# Patient Record
Sex: Male | Born: 1970 | Race: Black or African American | Hispanic: No | Marital: Single | Smoking: Never smoker
Health system: Southern US, Community
[De-identification: ages and names within clinical notes are randomized; demographics above are authoritative.]

## PROBLEM LIST (undated history)

## (undated) DIAGNOSIS — F259 Schizoaffective disorder, unspecified: Secondary | ICD-10-CM

## (undated) DIAGNOSIS — F319 Bipolar disorder, unspecified: Secondary | ICD-10-CM

## (undated) DIAGNOSIS — N189 Chronic kidney disease, unspecified: Secondary | ICD-10-CM

## (undated) DIAGNOSIS — F419 Anxiety disorder, unspecified: Secondary | ICD-10-CM

## (undated) DIAGNOSIS — I10 Essential (primary) hypertension: Secondary | ICD-10-CM

## (undated) DIAGNOSIS — F32A Depression, unspecified: Secondary | ICD-10-CM

## (undated) DIAGNOSIS — E119 Type 2 diabetes mellitus without complications: Secondary | ICD-10-CM

## (undated) DIAGNOSIS — S069XAA Unspecified intracranial injury with loss of consciousness status unknown, initial encounter: Secondary | ICD-10-CM

## (undated) DIAGNOSIS — K219 Gastro-esophageal reflux disease without esophagitis: Secondary | ICD-10-CM

## (undated) DIAGNOSIS — S069X9A Unspecified intracranial injury with loss of consciousness of unspecified duration, initial encounter: Secondary | ICD-10-CM

## (undated) DIAGNOSIS — F329 Major depressive disorder, single episode, unspecified: Secondary | ICD-10-CM

## (undated) HISTORY — DX: Anxiety disorder, unspecified: F41.9

## (undated) HISTORY — DX: Gastro-esophageal reflux disease without esophagitis: K21.9

## (undated) HISTORY — DX: Depression, unspecified: F32.A

## (undated) HISTORY — DX: Major depressive disorder, single episode, unspecified: F32.9

---

## 2017-08-12 ENCOUNTER — Encounter: Payer: Self-pay | Admitting: Nurse Practitioner

## 2017-08-12 ENCOUNTER — Ambulatory Visit (INDEPENDENT_AMBULATORY_CARE_PROVIDER_SITE_OTHER): Payer: Medicare Other | Admitting: Nurse Practitioner

## 2017-08-12 VITALS — BP 104/65 | HR 73 | Temp 97.9°F | Ht 71.5 in | Wt 234.0 lb

## 2017-08-12 DIAGNOSIS — F258 Other schizoaffective disorders: Secondary | ICD-10-CM

## 2017-08-12 DIAGNOSIS — F259 Schizoaffective disorder, unspecified: Secondary | ICD-10-CM

## 2017-08-12 DIAGNOSIS — Z7689 Persons encountering health services in other specified circumstances: Secondary | ICD-10-CM

## 2017-08-12 DIAGNOSIS — K219 Gastro-esophageal reflux disease without esophagitis: Secondary | ICD-10-CM

## 2017-08-12 NOTE — Progress Notes (Signed)
Subjective:    Patient ID: Jonathan Cabrera, male    DOB: 06-Jun-1970, 47 y.o.   MRN: [id number]  Jonathan Cabrera is a 47 y.o. male presenting on 08/12/2017 for Establish Care   HPI Establish Care New Provider Pt last seen by PCP several month ago.  Obtain records.  Is moved into family care home with Jonathan Cabrera.  Was previously at [facility] in [place].  Originally from [facility]. Was in family care home x 4 years, but facility closed and went to stay with his mother and grandmother.  Was admitted from [facility].  Still has hallucinations occasionally, but has not had any physical outbursts.  GERD Pt has history of acid reflux and is taking lansoprazole 40 mg once daily.  Pt and caregiver notes symptoms are well controlled.  Patient denies globus sensation, choking, nighttime awakening with cough or heartburn.  Denies bleeding symptoms.  Denies abdominal pain, constipation, diarrhea. - Patient and caregiver acknowledge dietary indiscretions with lots of little Debbie fruit snacks and large sodas that are consumed within 1 to 2 days of purchase.  Schizoaffective disorder Patient is currently being managed at a psychiatrist in the [place] area.  This care relationship is continuing, but patient and caregiver prefer to transition care locally.  Per patient report, medication doses have not changed in the recent 2 to 3 months.  Past Medical History:  Diagnosis Date  . Anxiety   . Depression   . GERD (gastroesophageal reflux disease)    History reviewed. No pertinent surgical history. Social History   Socioeconomic History  . Marital status: Single    Spouse name: Not on file  . Number of children: Not on file  . Years of education: Not on file  . Highest education level: Not on file  Occupational History  . Not on file  Social Needs  . Financial resource strain: Not on file  . Food insecurity:    Worry: Not on file    Inability: Not on file  . Transportation needs:   Medical: Not on file    Non-medical: Not on file  Tobacco Use  . Smoking status: Never Smoker  . Smokeless tobacco: Never Used  Substance and Sexual Activity  . Alcohol use: Never    Frequency: Never  . Drug use: Never  . Sexual activity: Not on file  Lifestyle  . Physical activity:    Days per week: Not on file    Minutes per session: Not on file  . Stress: Not on file  Relationships  . Social connections:    Talks on phone: Not on file    Gets together: Not on file    Attends religious service: Not on file    Active member of club or organization: Not on file    Attends meetings of clubs or organizations: Not on file    Relationship status: Not on file  . Intimate partner violence:    Fear of current or ex partner: Not on file    Emotionally abused: Not on file    Physically abused: Not on file    Forced sexual activity: Not on file  Other Topics Concern  . Not on file  Social History Narrative  . Not on file   History reviewed. No pertinent family history. Current Outpatient Medications on File Prior to Visit  Medication Sig  . amLODipine (NORVASC) 10 MG tablet Take 10 mg by mouth daily.  . benztropine (COGENTIN) 1 MG tablet Take 1 mg by  mouth 2 (two) times daily.  . fluPHENAZine (PROLIXIN) 5 MG tablet Take 10 mg by mouth daily.  . fluPHENAZine (PROLIXIN) 5 MG tablet Take 13 mg by mouth at bedtime.  Marland Kitchen lithium carbonate (LITHOBID) 300 MG CR tablet Take 600 mg by mouth 2 (two) times daily.  . Melatonin 3 MG TABS Take 2 tablets by mouth at bedtime.   Marland Kitchen QUEtiapine (SEROQUEL) 100 MG tablet Take 100 mg by mouth 2 (two) times daily.  . QUEtiapine (SEROQUEL) 300 MG tablet Take 300 mg by mouth at bedtime.   No current facility-administered medications on file prior to visit.     Review of Systems  Constitutional: Negative.   HENT: Negative.   Eyes: Negative.   Respiratory: Negative.   Cardiovascular: Negative.   Gastrointestinal: Negative.   Endocrine: Negative.     Genitourinary: Negative.   Musculoskeletal: Negative.   Skin: Negative.   Allergic/Immunologic: Negative.   Neurological: Negative.   Hematological: Negative.   Psychiatric/Behavioral: Positive for hallucinations (Rare hallucination, but none in the recent past patient admits today.). Negative for behavioral problems, self-injury, sleep disturbance and suicidal ideas. The patient is not nervous/anxious.    Per HPI unless specifically indicated above     Objective:    BP 104/65 (BP Location: Left Arm, Patient Position: Sitting, Cuff Size: Large)   Pulse 73   Temp 97.9 F (36.6 C) (Oral)   Ht 5' 11.5" (1.816 m)   Wt 234 lb (106.1 kg)   BMI 32.18 kg/m   Wt Readings from Last 3 Encounters:  08/12/17 234 lb (106.1 kg)    Physical Exam  Constitutional: He is oriented to person, place, and time. He appears well-developed and well-nourished. No distress.  HENT:  Head: Normocephalic and atraumatic.  Cardiovascular: Normal rate, regular rhythm, S1 normal, S2 normal, normal heart sounds and intact distal pulses.  Pulmonary/Chest: Effort normal and breath sounds normal. No respiratory distress.  Abdominal: Soft. Bowel sounds are normal. He exhibits no distension. There is no hepatosplenomegaly. There is no tenderness. No hernia.  Neurological: He is alert and oriented to person, place, and time.  Skin: Skin is warm and dry. Capillary refill takes less than 2 seconds.  Psychiatric: He has a normal mood and affect. His behavior is normal. Thought content normal.  Vitals reviewed.  No results found for this or any previous visit.    Assessment & Plan:   Problem List Items Addressed This Visit      Digestive   Gastroesophageal reflux disease - Primary Currently well controlled on lansoprazole 40 mg once daily.  Plan: 1. Continue lansoprazole 40 mg once daily. Side effects discussed. Pt wants to continue med. 2. Avoid diet triggers. Reviewed need to seek care if globus sensation,  difficulty swallowing, s/sx of GI bleed. 3. Follow up as needed and in 3 months.      Other   Schizoaffective disorder, chronic condition (HCC) Stable without recent behavioral outbursts.  Only occasional hallucinations.  Patient currently managed with psychiatry in [place].  Ongoing relationship for medication prescription.  Plan to make referral to our but once Medicaid card has changed to [facility] and to me as PCP.    Other Visit Diagnoses    Encounter to establish care    Previous PCP was at [facility].  Records will be requested.  Past medical, family, and surgical history reviewed w/ pt and caregiver.    Very little family history is known.         Follow up  plan: Return in about 3 months (around 11/11/2017) for FASTING blood work and GERD, Hypertension.  Wilhelmina Mcardle, DNP, AGPCNP-BC Adult Gerontology Primary Care Nurse Practitioner [facility] 09/09/2017, 7:39 PM

## 2017-08-12 NOTE — Patient Instructions (Addendum)
Rishik Camera operator,   Thank you for coming in to clinic today.  1. Continue medications without changes.  2.  Cut back on snacks.  Only buy one package, buy diet sodas.  Choose healthier foods like fruits and vegetables.  3. Once you get your medicaid card changed,  Call for a referral to [facility] in [place].  You will be due for FASTING BLOOD WORK.  This means you should eat no food or drink after midnight.  Drink only water or coffee without cream/sugar on the morning of your lab visit. - Please go ahead and schedule a "Lab Only" visit in the morning at the clinic for lab draw at your next visit. - Your results will be available about 2-3 days after blood draw.  If you have set up a MyChart account, you can can log in to MyChart online to view your results and a brief explanation. Also, we can discuss your results together at your next office visit if you would like.   Please schedule a follow-up appointment with Wilhelmina Mcardle, AGNP. Return in about 3 months (around 11/11/2017) for FASTING blood work and GERD, Hypertension.  If you have any other questions or concerns, please feel free to call the clinic or send a message through MyChart. You may also schedule an earlier appointment if necessary.  You will receive a survey after today's visit either digitally by e-mail or paper by [employer]. Your experiences and feedback matter to [place].  Please respond so we know how we are doing as we provide care for you.   Wilhelmina Mcardle, DNP, AGNP-BC Adult Gerontology Nurse Practitioner [facility]

## 2017-09-03 ENCOUNTER — Other Ambulatory Visit: Payer: Self-pay | Admitting: Nurse Practitioner

## 2017-09-03 DIAGNOSIS — K219 Gastro-esophageal reflux disease without esophagitis: Secondary | ICD-10-CM

## 2017-09-05 ENCOUNTER — Other Ambulatory Visit: Payer: Self-pay | Admitting: Nurse Practitioner

## 2017-09-05 DIAGNOSIS — K219 Gastro-esophageal reflux disease without esophagitis: Secondary | ICD-10-CM

## 2017-09-05 MED ORDER — OMEPRAZOLE 40 MG PO CPDR: 40.0000 mg | DELAYED_RELEASE_CAPSULE | Freq: Every day | ORAL | 3 refills | Status: DC

## 2017-09-09 ENCOUNTER — Encounter: Payer: Self-pay | Admitting: Nurse Practitioner

## 2017-09-09 DIAGNOSIS — F258 Other schizoaffective disorders: Secondary | ICD-10-CM

## 2017-09-09 DIAGNOSIS — F259 Schizoaffective disorder, unspecified: Secondary | ICD-10-CM | POA: Insufficient documentation

## 2017-09-09 DIAGNOSIS — K219 Gastro-esophageal reflux disease without esophagitis: Secondary | ICD-10-CM | POA: Insufficient documentation

## 2017-11-12 ENCOUNTER — Ambulatory Visit: Payer: Medicaid Other | Admitting: Nurse Practitioner

## 2017-12-02 ENCOUNTER — Ambulatory Visit (INDEPENDENT_AMBULATORY_CARE_PROVIDER_SITE_OTHER): Payer: Medicare Other | Admitting: Nurse Practitioner

## 2017-12-02 ENCOUNTER — Encounter: Payer: Self-pay | Admitting: Nurse Practitioner

## 2017-12-02 ENCOUNTER — Other Ambulatory Visit: Payer: Self-pay

## 2017-12-02 VITALS — BP 122/73 | HR 84 | Temp 98.5°F | Ht 71.5 in | Wt 262.8 lb

## 2017-12-02 DIAGNOSIS — R635 Abnormal weight gain: Secondary | ICD-10-CM

## 2017-12-02 DIAGNOSIS — K5909 Other constipation: Secondary | ICD-10-CM

## 2017-12-02 DIAGNOSIS — I10 Essential (primary) hypertension: Secondary | ICD-10-CM

## 2017-12-02 DIAGNOSIS — R631 Polydipsia: Secondary | ICD-10-CM | POA: Diagnosis not present

## 2017-12-02 DIAGNOSIS — K219 Gastro-esophageal reflux disease without esophagitis: Secondary | ICD-10-CM | POA: Diagnosis not present

## 2017-12-02 DIAGNOSIS — Z79899 Other long term (current) drug therapy: Secondary | ICD-10-CM

## 2017-12-02 MED ORDER — POLYETHYLENE GLYCOL 3350 17 GM/SCOOP PO POWD: 17.0000 g | Freq: Every day | ORAL | 1 refills | Status: DC | PRN

## 2017-12-02 MED ORDER — OMEPRAZOLE 40 MG PO CPDR: 40.0000 mg | DELAYED_RELEASE_CAPSULE | Freq: Every day | ORAL | 3 refills | Status: DC

## 2017-12-02 MED ORDER — AMLODIPINE BESYLATE 10 MG PO TABS: 10.0000 mg | ORAL_TABLET | Freq: Every day | ORAL | 5 refills | Status: DC

## 2017-12-02 NOTE — Assessment & Plan Note (Signed)
Currently stable on omeprazole 40 mg once daily. Patient has no bleeding symptoms except lower GI bleeding more consistent with hemorrhoids.  Plan: 1. Continue omeprazole 40 mg once daily. 2. Encouraged diet modifications, weight loss. 3. Followup 3-6 months.

## 2017-12-02 NOTE — Patient Instructions (Addendum)
Jonathan Cabrera operatorpeaker,   Thank you for coming in to clinic today.  1. Limit yourself to one honey bun per week.    2. Limit yourself to one extra salty snack per week.  3. Continue your diet soda.  4. May start miralax 1 pack daily as needed to have a bowel movement at least every 2 days.  Please schedule a follow-up appointment with Wilhelmina McardleLauren Osric Klopf, AGNP. Return in about 3 months (around 03/04/2018) for hypertension, constipation.  If you have any other questions or concerns, please feel free to call the clinic or send a message through MyChart. You may also schedule an earlier appointment if necessary.  You will receive a survey after today's visit either digitally by e-mail or paper by [employer] mail. Your experiences and feedback matter to us.  Please respond so we know how we are doing as we provide care for you.   Wilhelmina McardleLauren Lilla Callejo, DNP, AGNP-BC Adult Gerontology Nurse Practitioner [facility]  Low Back Pain Exercises See other page with pictures of each exercise.  Start with 1 or 2 of these exercises that you are most comfortable with. Do not do any exercises that cause you significant worsening pain. Some of these may cause some "stretching soreness" but it should go away after you stop the exercise, and get better over time. Gradually increase up to 3-4 exercises as tolerated.  Standing hamstring stretch: Place the heel of your leg on a stool about 15 inches high. Keep your knee straight. Lean forward, bending at the hips until you feel a mild stretch in the back of your thigh. Make sure you do not roll your shoulders and bend at the waist when doing this or you will stretch your lower back instead. Hold the stretch for 15 to 30 seconds. Repeat 3 times. Repeat the same stretch on your other leg.  Cat and camel: Get down on your hands and knees. Let your stomach sag, allowing your back to curve downward. Hold this position for 5 seconds. Then arch your back and hold for 5  seconds. Do 3 sets of 10.  Quadriped Arm/Leg Raises: Get down on your hands and knees. Tighten your abdominal muscles to stiffen your spine. While keeping your abdominals tight, raise one arm and the opposite leg away from you. Hold this position for 5 seconds. Lower your arm and leg slowly and alternate sides. Do this 10 times on each side.  Pelvic tilt: Lie on your back with your knees bent and your feet flat on the floor. Tighten your abdominal muscles and push your lower back into the floor. Hold this position for 5 seconds, then relax. Do 3 sets of 10.  Partial curl: Lie on your back with your knees bent and your feet flat on the floor. Tighten your stomach muscles and flatten your back against the floor. Tuck your chin to your chest. With your hands stretched out in front of you, curl your upper body forward until your shoulders clear the floor. Hold this position for 3 seconds. Don't hold your breath. It helps to breathe out as you lift your shoulders up. Relax. Repeat 10 times. Build to 3 sets of 10. To challenge yourself, clasp your hands behind your head and keep your elbows out to the side.  Lower trunk rotation: Lie on your back with your knees bent and your feet flat on the floor. Tighten your abdominal muscles and push your lower back into the floor. Keeping your shoulders down flat,  gently rotate your legs to one side, then the other as far as you can. Repeat 10 to 20 times.  Single knee to chest stretch: Lie on your back with your legs straight out in front of you. Bring one knee up to your chest and grasp the back of your thigh. Pull your knee toward your chest, stretching your buttock muscle. Hold this position for 15 to 30 seconds and return to the starting position. Repeat 3 times on each side.  Double knee to chest: Lie on your back with your knees bent and your feet flat on the floor. Tighten your abdominal muscles and push your lower back into the floor. Pull both knees up to your  chest. Hold for 5 seconds and repeat 10 to 20 times.

## 2017-12-02 NOTE — Progress Notes (Signed)
Subjective:    Patient ID: Jonathan Cabrera, male    DOB: 07-26-1970, 47 y.o.   MRN: [id number]  Jonathan Cabrera is a 47 y.o. male presenting on 12/02/2017 for Constipation (no normal bowel movement x 1 mth)  Patient is accompanied today by his group home caregiver, Francena Hanly.  HPI Constipation Has had 3 days without bowel movement.    Prior was once daily.    Goes about 4 times in a week.  No blood on paper with wiping.  Has seen blood in stool 2 weeks ago.  Patient is very poor historian related to bowel movements.  - Drinks lots of water.   - Is not taking any medication to have bowel movements.  Patient does ask about suppositories as "family has used these in past."  Hypertension - He is not checking BP at home or outside of clinic.     - Current medications: amlodipine 10 mg once daily, tolerating well without side effects - He is not currently symptomatic. - Pt denies headache, lightheadedness, dizziness, changes in vision, chest tightness/pressure, palpitations, leg swelling, sudden loss of speech or loss of consciousness. - He  reports no regular exercise routine. - His diet is moderate in salt, high in fat, and high in carbohydrates.   Weight Gain Patient continues having significant dietary indiscretions.  At his group home, he is provided breakfast, lunch, dinner and 2 snacks daily.  He also purchases his own snacks at the store.  He regularly buys 2 bags pork rinds, 2 boxes honeybuns (12 total) and eats these within 4 days.  He is limited to this amount once per week as is Stella's expectation to limit patient's .  Social History   Tobacco Use  . Smoking status: Never Smoker  . Smokeless tobacco: Never Used  Substance Use Topics  . Alcohol use: Never    Frequency: Never  . Drug use: Never    Review of Systems Per HPI unless specifically indicated above     Objective:    BP 122/73 (BP Location: Left Arm, Patient Position: Sitting, Cuff Size: Large)   Pulse 84   Temp  98.5 F (36.9 C) (Oral)   Ht 5' 11.5" (1.816 m)   Wt 262 lb 12.8 oz (119.2 kg)   SpO2 99%   BMI 36.14 kg/m   Wt Readings from Last 3 Encounters:  12/02/17 262 lb 12.8 oz (119.2 kg)  08/12/17 234 lb (106.1 kg)    Physical Exam  Constitutional: He is oriented to person, place, and time. He appears well-developed and well-nourished. No distress.  HENT:  Head: Normocephalic and atraumatic.  Cardiovascular: Normal rate, regular rhythm, S1 normal, S2 normal, normal heart sounds and intact distal pulses.  Pulmonary/Chest: Effort normal and breath sounds normal. No respiratory distress.  Abdominal: Soft. Bowel sounds are normal. He exhibits no distension. There is no hepatosplenomegaly. There is tenderness (mild tenderness) in the right lower quadrant, epigastric area and left lower quadrant. There is no rigidity, no rebound, no guarding, no CVA tenderness, no tenderness at McBurney's point and negative Murphy's sign. No hernia.  Neurological: He is alert and oriented to person, place, and time.  Skin: Skin is warm and dry.  Psychiatric: He has a normal mood and affect. His behavior is normal.  Vitals reviewed.      Assessment & Plan:   Problem List Items Addressed This Visit      Cardiovascular and Mediastinum   Essential hypertension    Controlled hypertension.  BP goal <  130/80.  Pt is not working on lifestyle modifications.  Taking medications tolerating well without side effects. No known complications, needs labs.  Plan: 1. Continue taking amlodipine 10 mg once daily 2. Obtain labs today.  3. Encouraged heart healthy diet and increasing exercise to 30 minutes most days of the week. 4. Check BP 1-2 x per week at home, keep log, and bring to clinic at next appointment. 5. Follow up 3 months.        Relevant Medications   amLODipine (NORVASC) 10 MG tablet   Other Relevant Orders   COMPLETE METABOLIC PANEL WITH GFR   Comprehensive metabolic panel     Digestive    Gastroesophageal reflux disease    Currently stable on omeprazole 40 mg once daily. Patient has no bleeding symptoms except lower GI bleeding more consistent with hemorrhoids.  Plan: 1. Continue omeprazole 40 mg once daily. 2. Encouraged diet modifications, weight loss. 3. Followup 3-6 months.      Relevant Medications   omeprazole (PRILOSEC) 40 MG capsule   polyethylene glycol powder (GLYCOLAX/MIRALAX) powder   Other Relevant Orders   COMPLETE METABOLIC PANEL WITH GFR   CBC with Differential/Platelet   Comprehensive metabolic panel     Other   Weight gain finding    Other Visit Diagnoses    Other constipation    -  Primary   Relevant Medications   polyethylene glycol powder (GLYCOLAX/MIRALAX) powder   Polydipsia       Relevant Orders   Hemoglobin A1c   High risk medication use       Relevant Orders   Hemoglobin A1c   Comprehensive metabolic panel      # Weight Gain: Patient with approx 30 lb weight gain over last 4 months.  Patient with significant dietary indiscretions.   Plan: - Reduce honey buns to 1 per week.  - Reduce salty snack to 1 per week.  - Continue eating regular meals as provided at group home. - Increase physical activity.  # Polydipsia Patient is on high risk psychiatric medications for potential to develop DM.  With weight gain and polydipsia, need to screen for development of DM.  # Constipation: Patient with intermittent constipation.  Possible development of hemorrhoids.  No current rectal/perianal pain.  Patient with RLQ and LLQ pain which is consistent with constipation. - START miralax 1 packet daily prn if no BM in 2 days. - Encourage continued water intake. - Followup prn and in 3 months.  Meds ordered this encounter  Medications  . omeprazole (PRILOSEC) 40 MG capsule    Sig: Take 1 capsule (40 mg total) by mouth daily.    Dispense:  30 capsule    Refill:  3    Order Specific Question:   Supervising Provider    Answer:   Smitty CordsKARAMALEGOS,  ALEXANDER J [[id number]]  . amLODipine (NORVASC) 10 MG tablet    Sig: Take 1 tablet (10 mg total) by mouth daily.    Dispense:  30 tablet    Refill:  5    Order Specific Question:   Supervising Provider    Answer:   Smitty CordsKARAMALEGOS, ALEXANDER J [[id number]]  . polyethylene glycol powder (GLYCOLAX/MIRALAX) powder    Sig: Take 17 g by mouth daily as needed for mild constipation (Take if no BM in 2 days).    Dispense:  3350 g    Refill:  1    Order Specific Question:   Supervising Provider    Answer:   Smitty CordsKARAMALEGOS, ALEXANDER J [[id number]]  Follow up plan: Return in about 3 months (around 03/04/2018) for hypertension, constipation.  Wilhelmina McardleLauren Janani Chamber, DNP, AGPCNP-BC Adult Gerontology Primary Care Nurse Practitioner [facility] 12/02/2017, 10:53 PM

## 2017-12-02 NOTE — Assessment & Plan Note (Signed)
Controlled hypertension.  BP goal < 130/80.  Pt is not working on lifestyle modifications.  Taking medications tolerating well without side effects. No known complications, needs labs.  Plan: 1. Continue taking amlodipine 10 mg once daily 2. Obtain labs today.  3. Encouraged heart healthy diet and increasing exercise to 30 minutes most days of the week. 4. Check BP 1-2 x per week at home, keep log, and bring to clinic at next appointment. 5. Follow up 3 months.   

## 2017-12-04 LAB — COMPLETE METABOLIC PANEL WITH GFR
AG Ratio: 1.9 (calc) (ref 1.0–2.5)
ALT: 13 U/L (ref 9–46)
AST: 14 U/L (ref 10–40)
Albumin: 4.3 g/dL (ref 3.6–5.1)
Alkaline phosphatase (APISO): 86 U/L (ref 40–115)
BUN: 10 mg/dL (ref 7–25)
CO2: 25 mmol/L (ref 20–32)
Calcium: 9.5 mg/dL (ref 8.6–10.3)
Chloride: 106 mmol/L (ref 98–110)
Creat: 1.3 mg/dL (ref 0.60–1.35)
GFR, Est African American: 77 mL/min/{1.73_m2} (ref >=60)
GFR, Est Non African American: 66 mL/min/{1.73_m2} (ref >=60)
Globulin: 2.3 g/dL (calc) (ref 1.9–3.7)
Glucose, Bld: 69 mg/dL (ref 65–99)
Potassium: 4.2 mmol/L (ref 3.5–5.3)
Sodium: 139 mmol/L (ref 135–146)
Total Bilirubin: 0.7 mg/dL (ref 0.2–1.2)
Total Protein: 6.6 g/dL (ref 6.1–8.1)

## 2017-12-04 LAB — CBC WITH DIFFERENTIAL/PLATELET
Basophils Absolute: 41 cells/uL (ref 0–200)
Basophils Relative: 1 %
Eosinophils Absolute: 250 cells/uL (ref 15–500)
Eosinophils Relative: 6.1 %
HCT: 42.5 % (ref 38.5–50.0)
Hemoglobin: 13.9 g/dL (ref 13.2–17.1)
Lymphs Abs: 1251 cells/uL (ref 850–3900)
MCH: 26.8 pg — ABNORMAL LOW (ref 27.0–33.0)
MCHC: 32.7 g/dL (ref 32.0–36.0)
MCV: 81.9 fL (ref 80.0–100.0)
MPV: 9.2 fL (ref 7.5–12.5)
Monocytes Relative: 10.2 %
Neutro Abs: 2140 cells/uL (ref 1500–7800)
Neutrophils Relative %: 52.2 %
Platelets: 236 10*3/uL (ref 140–400)
RBC: 5.19 10*6/uL (ref 4.20–5.80)
RDW: 13.3 % (ref 11.0–15.0)
Total Lymphocyte: 30.5 %
WBC mixed population: 418 cells/uL (ref 200–950)
WBC: 4.1 10*3/uL (ref 3.8–10.8)

## 2017-12-04 LAB — HEMOGLOBIN A1C W/OUT EAG: Hgb A1c MFr Bld: 5.2 % of total Hgb (ref <5.7)

## 2018-03-05 ENCOUNTER — Ambulatory Visit (INDEPENDENT_AMBULATORY_CARE_PROVIDER_SITE_OTHER): Payer: Medicare Other | Admitting: Nurse Practitioner

## 2018-03-05 ENCOUNTER — Encounter: Payer: Self-pay | Admitting: Nurse Practitioner

## 2018-03-05 ENCOUNTER — Other Ambulatory Visit: Payer: Self-pay

## 2018-03-05 VITALS — BP 134/77 | HR 83 | Temp 98.7°F | Ht 71.5 in | Wt 271.0 lb

## 2018-03-05 DIAGNOSIS — I10 Essential (primary) hypertension: Secondary | ICD-10-CM

## 2018-03-05 DIAGNOSIS — K5900 Constipation, unspecified: Secondary | ICD-10-CM

## 2018-03-05 DIAGNOSIS — Z23 Encounter for immunization: Secondary | ICD-10-CM | POA: Diagnosis not present

## 2018-03-05 DIAGNOSIS — R635 Abnormal weight gain: Secondary | ICD-10-CM | POA: Diagnosis not present

## 2018-03-05 NOTE — Patient Instructions (Signed)

## 2018-03-06 ENCOUNTER — Encounter: Payer: Self-pay | Admitting: Nurse Practitioner

## 2018-03-06 ENCOUNTER — Ambulatory Visit: Payer: Medicare Other | Admitting: Nurse Practitioner

## 2018-03-06 NOTE — Progress Notes (Signed)
Subjective:    Patient ID: Jonathan Cabrera, male    DOB: 09-Apr-1971, 47 y.o.   MRN: [id number]  Jonathan Cabrera is a 47 y.o. male presenting on 03/05/2018 for Hypertension and Constipation (pt reports not a normal bm x 3days, he haven't started the miralox )   HPI Hypertension - He is not checking BP at home or outside of clinic.    - Current medications: amlodipine 10 mg once daily, tolerating well without side effects - He is not currently symptomatic. - Pt denies headache, lightheadedness, dizziness, changes in vision, chest tightness/pressure, palpitations, leg swelling, sudden loss of speech or loss of consciousness. - He  reports no regular exercise routine, but does take short walks regularly. - His diet is high in salt, high in fat, and high in carbohydrates.   Constipation Has not started Miralax as ordered at last visit. Has not had BM in 3 days.  Usually has BM every other day.   - Has had small BM,  - Jonathan HanlyStella is controlling access to snacks with honey buns /chips/pork rinds/cream pie/raisin cakes/ sodas.  Snacks around 1pm. Breakfast 8:30 , lunch 12, dinner 3:30-4.  Then he wants to walk to restaurant for snack after 6-8 pm. (Chinese - egg roll, tea OR [employer] hot dogs  - onion rings, hot dog)  Weight Gain Patient continues to have weight gain.  He admits he takes a walk and tries to get a snack when he feels agitated, overworked sometimes.   Social History   Tobacco Use  . Smoking status: Never Smoker  . Smokeless tobacco: Never Used  Substance Use Topics  . Alcohol use: Never    Frequency: Never  . Drug use: Never    Review of Systems Per HPI unless specifically indicated above     Objective:    BP 134/77 (BP Location: Right Arm, Patient Position: Sitting, Cuff Size: Large)   Pulse 83   Temp 98.7 F (37.1 C) (Oral)   Ht 5' 11.5" (1.816 m)   Wt 271 lb (122.9 kg)   BMI 37.27 kg/m   Wt Readings from Last 3 Encounters:  03/05/18 271 lb (122.9 kg)  12/02/17  262 lb 12.8 oz (119.2 kg)  08/12/17 234 lb (106.1 kg)    Physical Exam  Constitutional: He is oriented to person, place, and time. He appears well-developed and well-nourished. No distress.  HENT:  Head: Normocephalic and atraumatic.  Cardiovascular: Normal rate, regular rhythm, S1 normal, S2 normal, normal heart sounds and intact distal pulses.  Pulmonary/Chest: Effort normal and breath sounds normal. No respiratory distress.  Neurological: He is alert and oriented to person, place, and time.  Skin: Skin is warm and dry.  Psychiatric: He has a normal mood and affect. His behavior is normal.  Vitals reviewed.  Results for orders placed or performed in visit on 12/02/17  COMPLETE METABOLIC PANEL WITH GFR  Result Value Ref Range   Glucose, Bld 69 65 - 99 mg/dL   BUN 10 7 - 25 mg/dL   Creat 1.301.30 8.650.60 - 7.841.35 mg/dL   GFR, Est Non African American 66 > OR = 60 mL/min/1.4973m2   GFR, Est African American 77 > OR = 60 mL/min/1.7073m2   BUN/Creatinine Ratio NOT APPLICABLE 6 - 22 (calc)   Sodium 139 135 - 146 mmol/L   Potassium 4.2 3.5 - 5.3 mmol/L   Chloride 106 98 - 110 mmol/L   CO2 25 20 - 32 mmol/L   Calcium 9.5 8.6 - 10.3 mg/dL  Total Protein 6.6 6.1 - 8.1 g/dL   Albumin 4.3 3.6 - 5.1 g/dL   Globulin 2.3 1.9 - 3.7 g/dL (calc)   AG Ratio 1.9 1.0 - 2.5 (calc)   Total Bilirubin 0.7 0.2 - 1.2 mg/dL   Alkaline phosphatase (APISO) 86 40 - 115 U/L   AST 14 10 - 40 U/L   ALT 13 9 - 46 U/L  Hemoglobin A1C w/out eAG  Result Value Ref Range   Hgb A1c MFr Bld 5.2 <5.7 % of total Hgb  CBC with Differential/Platelet  Result Value Ref Range   WBC 4.1 3.8 - 10.8 Thousand/uL   RBC 5.19 4.20 - 5.80 Million/uL   Hemoglobin 13.9 13.2 - 17.1 g/dL   HCT 11.9 14.7 - 82.9 %   MCV 81.9 80.0 - 100.0 fL   MCH 26.8 (L) 27.0 - 33.0 pg   MCHC 32.7 32.0 - 36.0 g/dL   RDW 56.2 13.0 - 86.5 %   Platelets 236 140 - 400 Thousand/uL   MPV 9.2 7.5 - 12.5 fL   Neutro Abs 2,140 1,500 - 7,800 cells/uL   Lymphs  Abs 1,251 850 - 3,900 cells/uL   WBC mixed population 418 200 - 950 cells/uL   Eosinophils Absolute 250 15 - 500 cells/uL   Basophils Absolute 41 0 - 200 cells/uL   Neutrophils Relative % 52.2 %   Total Lymphocyte 30.5 %   Monocytes Relative 10.2 %   Eosinophils Relative 6.1 %   Basophils Relative 1.0 %      Assessment & Plan:   Problem List Items Addressed This Visit      Cardiovascular and Mediastinum   Essential hypertension - Primary    Controlled hypertension.  BP goal < 130/80.  Pt is not working on lifestyle modifications.  Taking medications tolerating well without side effects. No known complications, needs labs.  Plan: 1. Continue taking amlodipine 10 mg once daily 2. Obtain labs today.  3. Encouraged heart healthy diet and increasing exercise to 30 minutes most days of the week. 4. Check BP 1-2 x per week at home, keep log, and bring to clinic at next appointment. 5. Follow up 3 months.          Other   Weight gain finding    Progressively worsening, but at slower interval than prior 3 months follow-up.  Patient is now having snacks controlled mostly by Jonathan Cabrera the home supervisor.  He still gets snacks during the week independently, however.  Sometimes, increases with need to have break/distraction from stressor.  Plan: 1. Discussed taking a walk when agitated.  Reduce extra snacks to once weekly. 2. Continue reducing some psychotropic meds as able with psychiatry.   3. Follow-up 3 mos       Other Visit Diagnoses    Constipation, unspecified constipation type     Mild constipation with bloating.  No use of miralax as ordered at last visit. Likely has low fiber diet. - Encouraged high fiber diet, adequate water intake. - Start miralax prn. - Follow-up prn.    Needs flu shot     Pt < age 31.  Needs annual influenza vaccine.  Plan: 1. Administer Quad flu vaccine.    Relevant Orders   Flu Vaccine QUAD 6+ mos PF IM (Fluarix Quad PF) (Completed)      Follow  up plan: Return in about 3 months (around 06/05/2018).  Wilhelmina Mcardle, DNP, AGPCNP-BC Adult Gerontology Primary Care Nurse Practitioner [facility]  03/06/2018, 4:39 PM

## 2018-03-06 NOTE — Assessment & Plan Note (Signed)
Controlled hypertension.  BP goal < 130/80.  Pt is not working on lifestyle modifications.  Taking medications tolerating well without side effects. No known complications, needs labs.  Plan: 1. Continue taking amlodipine 10 mg once daily 2. Obtain labs today.  3. Encouraged heart healthy diet and increasing exercise to 30 minutes most days of the week. 4. Check BP 1-2 x per week at home, keep log, and bring to clinic at next appointment. 5. Follow up 3 months.

## 2018-03-06 NOTE — Assessment & Plan Note (Signed)
Progressively worsening, but at slower interval than prior 3 months follow-up.  Patient is now having snacks controlled mostly by Francena HanlyStella the home supervisor.  He still gets snacks during the week independently, however.  Sometimes, increases with need to have break/distraction from stressor.  Plan: 1. Discussed taking a walk when agitated.  Reduce extra snacks to once weekly. 2. Continue reducing some psychotropic meds as able with psychiatry.   3. Follow-up 3 mos

## 2018-04-03 ENCOUNTER — Other Ambulatory Visit: Payer: Self-pay | Admitting: Nurse Practitioner

## 2018-04-03 DIAGNOSIS — K219 Gastro-esophageal reflux disease without esophagitis: Secondary | ICD-10-CM

## 2018-04-28 ENCOUNTER — Other Ambulatory Visit: Payer: Self-pay

## 2018-04-28 ENCOUNTER — Encounter: Payer: Self-pay | Admitting: Emergency Medicine

## 2018-04-28 ENCOUNTER — Emergency Department
Admission: EM | Admit: 2018-04-28 | Discharge: 2018-04-28 | Disposition: A | Discharge status: Home / Self Care | Payer: Self-pay | Attending: Emergency Medicine | Admitting: Emergency Medicine

## 2018-04-28 DIAGNOSIS — I10 Essential (primary) hypertension: Secondary | ICD-10-CM | POA: Insufficient documentation

## 2018-04-28 DIAGNOSIS — R55 Syncope and collapse: Secondary | ICD-10-CM | POA: Insufficient documentation

## 2018-04-28 DIAGNOSIS — Z79899 Other long term (current) drug therapy: Secondary | ICD-10-CM | POA: Insufficient documentation

## 2018-04-28 HISTORY — DX: Unspecified intracranial injury with loss of consciousness status unknown, initial encounter: S06.9XAA

## 2018-04-28 HISTORY — DX: Unspecified intracranial injury with loss of consciousness of unspecified duration, initial encounter: S06.9X9A

## 2018-04-28 HISTORY — DX: Schizoaffective disorder, unspecified: F25.9

## 2018-04-28 HISTORY — DX: Bipolar disorder, unspecified: F31.9

## 2018-04-28 LAB — BASIC METABOLIC PANEL
ANION GAP: 6 (ref 5–15)
BUN: 11 mg/dL (ref 6–20)
CO2: 22 mmol/L (ref 22–32)
Calcium: 9.8 mg/dL (ref 8.9–10.3)
Chloride: 108 mmol/L (ref 98–111)
Creatinine, Ser: 0.97 mg/dL (ref 0.61–1.24)
GFR calc Af Amer: >60 mL/min (ref >60)
GFR calc non Af Amer: >60 mL/min (ref >60)
Glucose, Bld: 108 mg/dL — ABNORMAL HIGH (ref 70–99)
Potassium: 3.9 mmol/L (ref 3.5–5.1)
Sodium: 136 mmol/L (ref 135–145)

## 2018-04-28 LAB — URINALYSIS, COMPLETE (UACMP) WITH MICROSCOPIC
Bacteria, UA: NONE SEEN
Bilirubin Urine: NEGATIVE
GLUCOSE, UA: NEGATIVE mg/dL
Hgb urine dipstick: NEGATIVE
Ketones, ur: NEGATIVE mg/dL
Leukocytes, UA: NEGATIVE
Nitrite: NEGATIVE
Protein, ur: NEGATIVE mg/dL
Specific Gravity, Urine: 1.005 (ref 1.005–1.030)
pH: 7 (ref 5.0–8.0)

## 2018-04-28 LAB — CBC
HCT: 44.9 % (ref 39.0–52.0)
Hemoglobin: 14.4 g/dL (ref 13.0–17.0)
MCH: 26.1 pg (ref 26.0–34.0)
MCHC: 32.1 g/dL (ref 30.0–36.0)
MCV: 81.5 fL (ref 80.0–100.0)
Platelets: 337 10*3/uL (ref 150–400)
RBC: 5.51 MIL/uL (ref 4.22–5.81)
RDW: 14 % (ref 11.5–15.5)
WBC: 7.5 10*3/uL (ref 4.0–10.5)
nRBC: 0 % (ref 0.0–0.2)

## 2018-04-28 NOTE — ED Notes (Signed)
Pt arrived via ems from [facility] with concerns over fall. Pt arrives a & o x 4. EMS HR 80, BP 132/84, O2 99%. Pt unsure of events leading up to fall. EMS reports upon their arrival pt was on the ground unresponsive. No medications administered. Pt unresponsive for an estimated 5-6 minutes

## 2018-04-28 NOTE — ED Triage Notes (Signed)
States he lives in a family care home. States he came by EMS due to syncopal episode at care home. Denies any discomfort now. Conversation cleatr and coherent.

## 2018-04-28 NOTE — ED Notes (Signed)
Pt given graham crackers with peanut butter and a cola per EDP.

## 2018-04-28 NOTE — ED Provider Notes (Signed)
[facility] Emergency Department Provider Note   ____________________________________________    I have reviewed the triage vital signs and the nursing notes.   HISTORY  Chief Complaint Loss of Consciousness     HPI Jonathan Cabrera is a 48 y.o. male with a history of schizoaffective disorder, GERD, anxiety, TBI who presents today after reported syncopal episode.  Patient lives in a group home and apparently had a syncopal episode today.  He does not remember what happened but does remember that he felt lightheaded prior to the event.  Denies chest pain shortness of breath.  Currently feels well and has no complaints.  No palpitations.  No nausea or vomiting or abdominal pain   Past Medical History:  Diagnosis Date  . Anxiety   . Bipolar 1 disorder (HCC)   . Depression   . GERD (gastroesophageal reflux disease)   . Schizoaffective disorder (HCC)   . TBI (traumatic brain injury) [facility](HCC)     Patient Active Problem List   Diagnosis Date Noted  . Essential hypertension 12/02/2017  . Weight gain finding 12/02/2017  . Schizoaffective disorder, chronic condition (HCC) 09/09/2017  . Gastroesophageal reflux disease 09/09/2017    History reviewed. No pertinent surgical history.  Prior to Admission medications   Medication Sig Start Date End Date Taking? Authorizing Provider  amLODipine (NORVASC) 10 MG tablet Take 1 tablet (10 mg total) by mouth daily. 12/02/17   Galen ManilaKennedy, Lauren Renee, NP  benztropine (COGENTIN) 1 MG tablet Take 1 mg by mouth 2 (two) times daily.    [provider]  fluPHENAZine (PROLIXIN) 5 MG tablet Take 10 mg by mouth daily.    [provider]  fluPHENAZine (PROLIXIN) 5 MG tablet Take 13 mg by mouth at bedtime.    [provider]  lithium carbonate (LITHOBID) 300 MG CR tablet Take 600 mg by mouth 2 (two) times daily.    [provider]  Melatonin 3 MG TABS Take 2 tablets by mouth at bedtime.      [provider]  omeprazole (PRILOSEC) 40 MG capsule TAKE ONE CAPSULE BY MOUTH EVERY DAY *DO NOT CRUSH* 04/04/18   Galen ManilaKennedy, Lauren Renee, NP  polyethylene glycol powder (GLYCOLAX/MIRALAX) powder Take 17 g by mouth daily as needed for mild constipation (Take if no BM in 2 days). 12/02/17   Galen ManilaKennedy, Lauren Renee, NP  QUEtiapine (SEROQUEL) 100 MG tablet Take 100 mg by mouth 2 (two) times daily.    [provider]  QUEtiapine (SEROQUEL) 300 MG tablet Take 300 mg by mouth at bedtime.    [provider]     Allergies Erythromycin and Penicillins  No family history on file.  Social History Social History   Tobacco Use  . Smoking status: Never Smoker  . Smokeless tobacco: Never Used  Substance Use Topics  . Alcohol use: Never    Frequency: Never  . Drug use: Never    Review of Systems  Constitutional: No dizziness Eyes: No visual changes.  ENT: No neck pain Cardiovascular: Denies chest pain. Respiratory: Denies shortness of breath. Gastrointestinal: No abdominal pain.    Genitourinary: Negative for dysuria. Musculoskeletal: Negative for back pain. Skin: Negative for rash. Neurological: Negative for headaches    ____________________________________________   PHYSICAL EXAM:  VITAL SIGNS: ED Triage Vitals [04/28/18 1249]  Enc Vitals Group     BP (!) 150/102     Pulse Rate 77     Resp 18     Temp 98.3 F (36.8 C)  Temp Source Oral     SpO2 96 %     Weight 121.6 kg (268 lb)     Height 1.829 m (6')     Head Circumference      Peak Flow      Pain Score 0     Pain Loc      Pain Edu?      Excl. in GC?     Constitutional: Alert and oriented. No acute distress.  Eyes: Conjunctivae are normal.   Nose: No congestion/rhinnorhea. Mouth/Throat: Mucous membranes are moist.   Neck:  Painless ROM Cardiovascular: Normal rate, regular rhythm. Grossly normal heart sounds.  Good peripheral circulation. Respiratory: Normal respiratory effort.  No  retractions. Lungs CTAB. Gastrointestinal: Soft and nontender. No distention.  No CVA tenderness.  Musculoskeletal: a.  Warm and well perfused Neurologic:  Normal speech and language. No gross focal neurologic deficits are appreciated.  Skin:  Skin is warm, dry and intact. No rash noted. Psychiatric: Mood and affect are normal. Speech and behavior are normal.  ____________________________________________   LABS (all labs ordered are listed, but only abnormal results are displayed)  Labs Reviewed  BASIC METABOLIC PANEL - Abnormal; Notable for the following components:      Result Value   Glucose, Bld 108 (*)    All other components within normal limits  URINALYSIS, COMPLETE (UACMP) WITH MICROSCOPIC - Abnormal; Notable for the following components:   Color, Urine STRAW (*)    APPearance CLEAR (*)    All other components within normal limits  CBC   ____________________________________________  EKG  ED ECG REPORT I, Jene Every, the attending physician, personally viewed and interpreted this ECG.  Date: 04/28/2018  Rhythm: normal sinus rhythm QRS Axis: normal Intervals: normal ST/T Wave abnormalities: normal Narrative Interpretation: no evidence of acute ischemia  ____________________________________________  RADIOLOGY  None ____________________________________________   PROCEDURES  Procedure(s) performed: No  Procedures   Critical Care performed: No ____________________________________________   INITIAL IMPRESSION / ASSESSMENT AND PLAN / ED COURSE  Pertinent labs & imaging results that were available during my care of the patient were reviewed by me and considered in my medical decision making (see chart for details).  Patient well-appearing and in no acute distress.  Exam is reassuring, work-up is unremarkable.  EKG normal.  He feels quite well, no dizziness with standing.  Appropriate for discharge at this point with outpatient follow-up      ____________________________________________   FINAL CLINICAL IMPRESSION(S) / ED DIAGNOSES  Final diagnoses:  Syncope and collapse        Note:  This document was prepared using Dragon voice recognition software and may include unintentional dictation errors.   Jene Every, MD 04/28/18 [phone]

## 2018-04-28 NOTE — Discharge Instructions (Addendum)
Jonathan Cabrera tests were reassuring in the ED however he will require follow up with his PCP

## 2018-04-28 NOTE — ED Notes (Signed)
[facility] called, Stela spoken to who states she will be providing transportation for pt.

## 2018-06-03 ENCOUNTER — Other Ambulatory Visit: Payer: Self-pay

## 2018-06-03 ENCOUNTER — Encounter: Payer: Self-pay | Admitting: Nurse Practitioner

## 2018-06-03 ENCOUNTER — Ambulatory Visit (INDEPENDENT_AMBULATORY_CARE_PROVIDER_SITE_OTHER): Payer: Medicare Other | Admitting: Nurse Practitioner

## 2018-06-03 ENCOUNTER — Telehealth: Payer: Self-pay | Admitting: Nurse Practitioner

## 2018-06-03 VITALS — BP 132/89 | HR 87 | Temp 98.6°F | Resp 20 | Ht 72.0 in | Wt 281.4 lb

## 2018-06-03 DIAGNOSIS — B370 Candidal stomatitis: Secondary | ICD-10-CM | POA: Diagnosis not present

## 2018-06-03 DIAGNOSIS — R635 Abnormal weight gain: Secondary | ICD-10-CM | POA: Diagnosis not present

## 2018-06-03 DIAGNOSIS — I10 Essential (primary) hypertension: Secondary | ICD-10-CM

## 2018-06-03 DIAGNOSIS — F258 Other schizoaffective disorders: Secondary | ICD-10-CM

## 2018-06-03 DIAGNOSIS — F259 Schizoaffective disorder, unspecified: Secondary | ICD-10-CM

## 2018-06-03 DIAGNOSIS — K5909 Other constipation: Secondary | ICD-10-CM | POA: Diagnosis not present

## 2018-06-03 MED ORDER — POLYETHYLENE GLYCOL 3350 17 GM/SCOOP PO POWD: 8.5000 g | Freq: Every day | ORAL | 1 refills | Status: DC

## 2018-06-03 MED ORDER — NYSTATIN 100000 UNIT/ML MT SUSP: 5.0000 mL | Freq: Four times a day (QID) | OROMUCOSAL | 0 refills | Status: AC

## 2018-06-03 NOTE — Progress Notes (Signed)
Subjective:    Patient ID: Jonathan Cabrera, male    DOB: 01/11/1971, 48 y.o.   MRN: [id number]  Jonathan BailiffMorris Mages is a 48 y.o. male presenting on 06/03/2018 for Hypertension; Ear Pain (bilateral ear discomfort w/ difficulty hearing, ear irritation); and Constipation   HPI  Constipation Patient has not been giving Miralax because patient is not requesting doses.  When taking, didn't work but caused bloating.  Patient is going to have to administer suppository himself.   - Patient has about 3 days between bowel movements.  Patient has small BM and doesn't feel completely empty. Patient took only 1-2 doses  Ear symptoms - Hearing difficulty, ears feeling clogged.  Patient in past has had fluid in ear and has had abx with improvement in past.  Is now also sore.  Patient has had no other cough/congestion.    Weight Patient has increased additional 10 lbs.   - [employer] visits continue about 3-4 days per week.  Housemates have stopped helping him get food. - [place] [facility] - monitoring weight medications  - some blood work in past.  Patient is working to reduce Seroquel with psychiatry currently to use a different medication.  Hypertension - He is not checking BP at home or outside of clinic.    - Current medications: tolerating well without side effects - He is not currently symptomatic. - Pt denies headache, lightheadedness, dizziness, changes in vision, chest tightness/pressure, palpitations, leg swelling, sudden loss of speech or loss of consciousness. - He  reports an exercise routine that includes walking, 20 minutes, 4 days per week. - His diet is moderate in salt, high in fat, and high in carbohydrates.   Social History   Tobacco Use  . Smoking status: Never Smoker  . Smokeless tobacco: Never Used  Substance Use Topics  . Alcohol use: Never    Frequency: Never  . Drug use: Never    Review of Systems Per HPI unless specifically indicated above     Objective:      BP 132/89 (BP Location: Left Arm, Patient Position: Sitting, Cuff Size: Large)   Pulse 87   Temp 98.6 F (37 C) (Oral)   Resp 20   Ht 6' (1.829 m)   Wt 281 lb 6.4 oz (127.6 kg)   SpO2 100%   BMI 38.16 kg/m   Wt Readings from Last 3 Encounters:  06/03/18 281 lb 6.4 oz (127.6 kg)  04/28/18 268 lb (121.6 kg)  03/05/18 271 lb (122.9 kg)    Physical Exam Vitals signs reviewed.  Constitutional:      General: He is not in acute distress.    Appearance: He is well-developed. He is morbidly obese.  HENT:     Head: Normocephalic and atraumatic.  Cardiovascular:     Rate and Rhythm: Normal rate and regular rhythm.     Heart sounds: Normal heart sounds, S1 normal and S2 normal.  Pulmonary:     Effort: Pulmonary effort is normal. No respiratory distress.     Breath sounds: Normal breath sounds.  Skin:    General: Skin is warm and dry.     Capillary Refill: Capillary refill takes less than 2 seconds.  Neurological:     General: No focal deficit present.     Mental Status: He is alert and oriented to person, place, and time. Mental status is at baseline.  Psychiatric:        Attention and Perception: Attention normal.  Mood and Affect: Mood normal.        Speech: Speech is rapid and pressured.        Behavior: Behavior normal.        Thought Content: Thought content normal.        Judgment: Judgment normal.     Results for orders placed or performed during the hospital encounter of 04/28/18  Basic metabolic panel  Result Value Ref Range   Sodium 136 135 - 145 mmol/L   Potassium 3.9 3.5 - 5.1 mmol/L   Chloride 108 98 - 111 mmol/L   CO2 22 22 - 32 mmol/L   Glucose, Bld 108 (H) 70 - 99 mg/dL   BUN 11 6 - 20 mg/dL   Creatinine, Ser 8.81 0.61 - 1.24 mg/dL   Calcium 9.8 8.9 - 10.3 mg/dL   GFR calc non Af Amer >60 >60 mL/min   GFR calc Af Amer >60 >60 mL/min   Anion gap 6 5 - 15  CBC  Result Value Ref Range   WBC 7.5 4.0 - 10.5 K/uL   RBC 5.51 4.22 - 5.81 MIL/uL    Hemoglobin 14.4 13.0 - 17.0 g/dL   HCT 15.9 45.8 - 59.2 %   MCV 81.5 80.0 - 100.0 fL   MCH 26.1 26.0 - 34.0 pg   MCHC 32.1 30.0 - 36.0 g/dL   RDW 92.4 46.2 - 86.3 %   Platelets 337 150 - 400 K/uL   nRBC 0.0 0.0 - 0.2 %  Urinalysis, Complete w Microscopic  Result Value Ref Range   Color, Urine STRAW (A) YELLOW   APPearance CLEAR (A) CLEAR   Specific Gravity, Urine 1.005 1.005 - 1.030   pH 7.0 5.0 - 8.0   Glucose, UA NEGATIVE NEGATIVE mg/dL   Hgb urine dipstick NEGATIVE NEGATIVE   Bilirubin Urine NEGATIVE NEGATIVE   Ketones, ur NEGATIVE NEGATIVE mg/dL   Protein, ur NEGATIVE NEGATIVE mg/dL   Nitrite NEGATIVE NEGATIVE   Leukocytes, UA NEGATIVE NEGATIVE   RBC / HPF 0-5 0 - 5 RBC/hpf   WBC, UA 0-5 0 - 5 WBC/hpf   Bacteria, UA NONE SEEN NONE SEEN   Squamous Epithelial / LPF 0-5 0 - 5   Mucus PRESENT       Assessment & Plan:   Problem List Items Addressed This Visit      Cardiovascular and Mediastinum   Essential hypertension Controlled hypertension.  BP goal < 130/80.  Pt is starting to work on lifestyle modifications.  Taking medications tolerating well without side effects. Complications: weight gain  Plan: 1. Continue taking medications at current doses 2. Obtain labs next visit  3. Encouraged heart healthy diet and increasing exercise to 30 minutes most days of the week. 4. Check BP 1-2 x per week at home, keep log, and bring to clinic at next appointment. 5. Follow up 3 months.       Other   Schizoaffective disorder, chronic condition (HCC) Patient with stable schizoaffective disorder, but with persistent weight gain on current meds.  Psychiatry has started weaning process for changes to medications.  Weight gain is moderating some and patient has made some changes to foods with less frequent trips to the local diner.  Plan: 1. Continue follow-up with psychiatry for med adjustment.  I agree with steps to reduce symptoms. 2. Follow-up prn.    Weight gain finding   Worsening persistently.  Continue to work toward reducing snacks, sweets.  Patient struggles with these most.  Follow-up 3 months.  Other Visit Diagnoses    Thrush    -  Primary Acute thrush - asymptomatic.  START nystatin suspension, swish, gargle, spit four times daily for 10 days. Follow-up if needed.   Relevant Medications   nystatin (MYCOSTATIN) 100000 UNIT/ML suspension   Other constipation     Patient with rare use of miralax, continued constipation.  Patient states he has had success with dulcolax suppository in past and wants to start these.    Plan: 1. Discussed use of suppository should be only in circumstances where miralax and stool softeners are not helpful. 2. START miralax 1/2 dose daily.  This will now be scheduled and prescription is changed. 3. FOLLOW-UP 3 months and prn if persists.   Relevant Medications   polyethylene glycol powder (GLYCOLAX/MIRALAX) powder      Meds ordered this encounter  Medications  . polyethylene glycol powder (GLYCOLAX/MIRALAX) powder    Sig: Take 8.5 g by mouth daily.    Dispense:  3350 g    Refill:  1    Please relabel current supply bottle - per Francena Hanly    Order Specific Question:   Supervising Provider    Answer:   Smitty Cords [[id number]]  . nystatin (MYCOSTATIN) 100000 UNIT/ML suspension    Sig: Take 5 mLs (500,000 Units total) by mouth 4 (four) times daily for 10 days. Take after meals and at bedtime.  Swish, gargle, spit.    Dispense:  200 mL    Refill:  0    Order Specific Question:   Supervising Provider    Answer:   Smitty Cords [[id number]]    Follow up plan: Return in about 3 months (around 09/01/2018) for hypertension, GERD.  Wilhelmina Mcardle, DNP, AGPCNP-BC Adult Gerontology Primary Care Nurse Practitioner [facility] 06/03/2018, 11:09 AM

## 2018-06-03 NOTE — Telephone Encounter (Signed)
Please fax [facility] a discontinue prn miralax per Francena Hanly.

## 2018-06-03 NOTE — Patient Instructions (Addendum)
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  1. Take 1/2 dose Miralax daily to help keep bowel movements regular. - If not having bowel movement at least once every 3 days, call clinic.  2. Continue working with psychiatry to work on medications.  3. New thrush - start nystatin liquid rinse.  Take after meals and at bedtime for 10 days.  4. Ears are normal today except for ear wax that is being flushed.  Please schedule a follow-up appointment with Wilhelmina Mcardle, AGNP. Return in about 3 months (around 09/01/2018) for hypertension, GERD.  If you have any other questions or concerns, please feel free to call the clinic or send a message through MyChart. You may also schedule an earlier appointment if necessary.  You will receive a survey after today's visit either digitally by e-mail or paper by [employer]. Your experiences and feedback matter to [place].  Please respond so we know how we are doing as we provide care for you.  Wilhelmina Mcardle, DNP, AGNP-BC Adult Gerontology Nurse Practitioner [facility] ([facility]), [facility]   Oral Jonathan Cabrera, Adult  Oral thrush, also called oral candidiasis, is a fungal infection that develops in the mouth and throat and on the tongue. It causes white patches to form on the mouth and tongue. Jonathan Cabrera is most common in older adults, but it can occur at any age. Many cases of thrush are mild, but this infection can also be serious. Jonathan Cabrera can be a repeated (recurrent) problem for certain people who have a weak body defense system (immune system). The weakness can be caused by chronic illnesses, or by taking medicines that limit the body's ability to fight infection. If a person has difficulty fighting infection, the fungus that causes thrush can spread through the body. This can cause life-threatening blood or organ infections. What are the causes? This condition is caused by a fungus (yeast) called Candida albicans.  This fungus is normally present in  small amounts in the mouth and on other mucous membranes. It usually causes no harm.  If conditions are present that allow the fungus to grow without control, it invades surrounding tissues and becomes an infection.  Other Candida species can also lead to thrush (rare). What increases the risk? This condition is more likely to develop in:  People with a weakened immune system.  Older adults.  People with HIV (human immunodeficiency virus).  People with diabetes.  People with dry mouth (xerostomia).  Pregnant women.  People with poor dental care, especially people who have false teeth.  People who use antibiotic medicines. What are the signs or symptoms? Symptoms of this condition can vary from mild and moderate to severe and persistent. Symptoms may include:  A burning feeling in the mouth and throat. This can occur at the start of a thrush infection.  White patches that stick to the mouth and tongue. The tissue around the patches may be red, raw, and painful. If rubbed (during tooth brushing, for example), the patches and the tissue of the mouth may bleed easily.  A bad taste in the mouth or difficulty tasting foods.  A cottony feeling in the mouth.  Pain during eating and swallowing.  Poor appetite.  Cracking at the corners of the mouth. How is this diagnosed? This condition is diagnosed based on:  Physical exam. Your health care provider will look in your mouth.  Health history. Your health care provider will ask you questions about your health. How is  this treated? This condition is treated with medicines called antifungals, which prevent the growth of fungi. These medicines are either applied directly to the affected area (topical) or swallowed (oral). The treatment will depend on the severity of the condition. Mild thrush Mild cases of thrush may clear up with the use of an antifungal mouth rinse or lozenges. Treatment usually lasts about 14 days. Moderate to  severe thrush  More severe thrush infections that have spread to the esophagus are treated with an oral antifungal medicine. A topical antifungal medicine may also be used.  For some severe infections, treatment may need to continue for more than 14 days.  Oral antifungal medicines are rarely used during pregnancy because they may be harmful to the unborn child. If you are pregnant, talk with your health care provider about options for treatment. Persistent or recurrent thrush For cases of thrush that do not go away or keep coming back:  Treatment may be needed twice as long as the symptoms last.  Treatment will include both oral and topical antifungal medicines.  People with a weakened immune system can take an antifungal medicine on a continuous basis to prevent thrush infections. It is important to treat conditions that make a person more likely to get thrush, such as diabetes or HIV. Follow these instructions at home: Medicines  Take over-the-counter and prescription medicines only as told by your health care provider.  Talk with your health care provider about an over-the-counter medicine called gentian violet, which kills bacteria and fungi. Relieving soreness and discomfort To help reduce the discomfort of thrush:  Drink cold liquids such as water or iced tea.  Try flavored ice treats or frozen juices.  Eat foods that are easy to swallow, such as gelatin, ice cream, or custard.  Try drinking from a straw if the patches in your mouth are painful.  General instructions  Eat plain, unflavored yogurt as directed by your health care provider. Check the label to make sure the yogurt contains live cultures. This yogurt can help healthy bacteria to grow in the mouth and can stop the growth of the fungus that causes thrush.  If you wear dentures, remove the dentures before going to bed, brush them vigorously, and soak them in a cleaning solution as directed by your health care  provider.  Rinse your mouth with a warm salt-water mixture several times a day. To make a salt-water mixture, completely dissolve 1/2-1 tsp of salt in 1 cup of warm water. Contact a health care provider if:  Your symptoms are getting worse or are not improving within 7 days of starting treatment.  You have symptoms of a spreading infection, such as white patches on the skin outside of the mouth. This information is not intended to replace advice given to you by your health care provider. Make sure you discuss any questions you have with your health care provider. Document Released: 12/27/2003 Document Revised: 12/26/2015 Document Reviewed: 12/26/2015 Elsevier Interactive Patient Education  [employer].

## 2018-06-06 ENCOUNTER — Encounter: Payer: Self-pay | Admitting: Nurse Practitioner

## 2018-06-24 ENCOUNTER — Ambulatory Visit: Payer: Medicare Other

## 2018-07-29 ENCOUNTER — Telehealth: Payer: Self-pay

## 2018-07-29 NOTE — Telephone Encounter (Signed)
Patient scheduled for an AWV on 08/05/2018 with [facility], Due to Covid-19 pandemic this is unable to be done in office, called patient to see if they are able to do this virtually or if it needed to rescheduled for an in office for after June 2020. Spoke with patients caregiver who will see if patient can do this virtually and will have him call back. Direct number provided.

## 2018-08-05 ENCOUNTER — Telehealth: Payer: Self-pay

## 2018-08-05 ENCOUNTER — Ambulatory Visit: Payer: Medicare Other

## 2018-08-05 NOTE — Telephone Encounter (Signed)
Patient scheduled for an AWV with [facility], Due to Covid-19 pandemic this is unable to be done in office, called patient to see if they are able to do this virtually or if it needed to rescheduled for an in office for after June 2020.   Spoke with Jonathan Cabrera, she prefers to have this done in office. Rescheduled for 11/25/2018. Confirmed appt.

## 2018-09-03 ENCOUNTER — Ambulatory Visit: Payer: Medicare Other | Admitting: Nurse Practitioner

## 2018-10-08 ENCOUNTER — Encounter: Payer: Self-pay | Admitting: Emergency Medicine

## 2018-10-08 ENCOUNTER — Other Ambulatory Visit: Payer: Self-pay

## 2018-10-08 ENCOUNTER — Emergency Department
Admission: EM | Admit: 2018-10-08 | Discharge: 2018-10-08 | Disposition: A | Discharge status: Home / Self Care | Payer: Medicare Other | Attending: Emergency Medicine | Admitting: Emergency Medicine

## 2018-10-08 DIAGNOSIS — I10 Essential (primary) hypertension: Secondary | ICD-10-CM | POA: Insufficient documentation

## 2018-10-08 DIAGNOSIS — K0889 Other specified disorders of teeth and supporting structures: Secondary | ICD-10-CM | POA: Diagnosis present

## 2018-10-08 DIAGNOSIS — Z79899 Other long term (current) drug therapy: Secondary | ICD-10-CM | POA: Diagnosis not present

## 2018-10-08 DIAGNOSIS — K047 Periapical abscess without sinus: Secondary | ICD-10-CM | POA: Insufficient documentation

## 2018-10-08 LAB — CBC
HCT: 42.4 % (ref 39.0–52.0)
Hemoglobin: 13.6 g/dL (ref 13.0–17.0)
MCH: 26.1 pg (ref 26.0–34.0)
MCHC: 32.1 g/dL (ref 30.0–36.0)
MCV: 81.4 fL (ref 80.0–100.0)
Platelets: 271 10*3/uL (ref 150–400)
RBC: 5.21 MIL/uL (ref 4.22–5.81)
RDW: 14.2 % (ref 11.5–15.5)
WBC: 10 10*3/uL (ref 4.0–10.5)
nRBC: 0 % (ref 0.0–0.2)

## 2018-10-08 LAB — COMPREHENSIVE METABOLIC PANEL
ALT: 18 U/L (ref 0–44)
AST: 18 U/L (ref 15–41)
Albumin: 4.4 g/dL (ref 3.5–5.0)
Alkaline Phosphatase: 109 U/L (ref 38–126)
Anion gap: 7 (ref 5–15)
BUN: 7 mg/dL (ref 6–20)
CO2: 23 mmol/L (ref 22–32)
Calcium: 9.5 mg/dL (ref 8.9–10.3)
Chloride: 108 mmol/L (ref 98–111)
Creatinine, Ser: 1.25 mg/dL — ABNORMAL HIGH (ref 0.61–1.24)
GFR calc Af Amer: >60 mL/min (ref >60)
GFR calc non Af Amer: >60 mL/min (ref >60)
Glucose, Bld: 114 mg/dL — ABNORMAL HIGH (ref 70–99)
Potassium: 3.7 mmol/L (ref 3.5–5.1)
Sodium: 138 mmol/L (ref 135–145)
Total Bilirubin: 0.8 mg/dL (ref 0.3–1.2)
Total Protein: 7.9 g/dL (ref 6.5–8.1)

## 2018-10-08 MED ORDER — CLINDAMYCIN HCL 150 MG PO CAPS: 450.0000 mg | ORAL_CAPSULE | Freq: Three times a day (TID) | ORAL | 0 refills | Status: AC

## 2018-10-08 NOTE — Discharge Instructions (Addendum)
Please seek medical attention for any high fevers, chest pain, shortness of breath, change in behavior, persistent vomiting, bloody stool or any other new or concerning symptoms.  

## 2018-10-08 NOTE — ED Notes (Signed)
Per Dr Archie Balboa- pt given diet soda

## 2018-10-08 NOTE — ED Notes (Signed)
Spoke with Dr. Burlene Arnt regarding patient. Verbal order given for IV start, CBC and CMP.

## 2018-10-08 NOTE — ED Triage Notes (Signed)
Patient reports waking up approximately 2 am with significant swelling to both lips and across face. Patient denies history of same. Denies SOB or trouble swallowing. Patient states swelling has increased since when it first started.

## 2018-10-08 NOTE — ED Notes (Signed)
Called Jonathan Cabrera care giver/group home staff.  She will come pick up.

## 2018-10-08 NOTE — ED Notes (Addendum)
Pt states at 2AM he noticed his upper lip and nose was swollen- states that now it is sore and a little numb- denies taking any new medications- states he can still breath through his nose

## 2018-10-08 NOTE — ED Provider Notes (Signed)
[facility] Emergency Department Provider Note  ____________________________________________   I have reviewed the triage vital signs and the nursing notes.   HISTORY  Chief Complaint Facial Swelling   History limited by: Not Limited   HPI Jonathan Cabrera is a 48 y.o. male who presents to the emergency department today because of concern for lip swelling. The patient states that he woke up last night and noticed some swelling to his upper lip and gum. It then continued to progress and he felt like his nose and part of his right cheek were also swelling. The patient denies similar symptoms in the past. No new medications. Denies any trauma. Mild discomfort and some tingling sensation to his cheek. The patient has not had any fevers.    Records reviewed. Per medical record review patient has a history of hypertension.  Past Medical History:  Diagnosis Date  . Anxiety   . Bipolar 1 disorder ([place])   . Depression   . GERD (gastroesophageal reflux disease)   . Schizoaffective disorder ([place])   . TBI (traumatic brain injury) [facility])     Patient Active Problem List   Diagnosis Date Noted  . Essential hypertension 12/02/2017  . Weight gain finding 12/02/2017  . Schizoaffective disorder, chronic condition ([place]) 09/09/2017  . Gastroesophageal reflux disease 09/09/2017    History reviewed. No pertinent surgical history.  Prior to Admission medications   Medication Sig Start Date End Date Taking? Authorizing Provider  amLODipine (NORVASC) 10 MG tablet Take 1 tablet (10 mg total) by mouth daily. 12/02/17   Mikey College, NP  benztropine (COGENTIN) 1 MG tablet Take 1 mg by mouth 2 (two) times daily.    [provider]  fluPHENAZine (PROLIXIN) 5 MG tablet Take 10 mg by mouth daily.    [provider]  fluPHENAZine (PROLIXIN) 5 MG tablet Take 13 mg by mouth at bedtime.    [provider]  lithium carbonate (LITHOBID) 300 MG CR tablet  Take 600 mg by mouth 2 (two) times daily.    [provider]  Melatonin 3 MG TABS Take 2 tablets by mouth at bedtime.     [provider]  omeprazole (PRILOSEC) 40 MG capsule TAKE ONE CAPSULE BY MOUTH EVERY DAY *DO NOT CRUSH* 04/04/18   Mikey [employer], NP  polyethylene glycol powder (GLYCOLAX/MIRALAX) powder Take 8.5 g by mouth daily. 06/03/18   Mikey [employer], NP  QUEtiapine (SEROQUEL) 100 MG tablet Take 100 mg by mouth 2 (two) times daily.    [provider]  QUEtiapine (SEROQUEL) 300 MG tablet Take 300 mg by mouth at bedtime.    [provider]    Allergies Erythromycin and Penicillins  No family history on file.  Social History Social History   Tobacco Use  . Smoking status: Never Smoker  . Smokeless tobacco: Never Used  Substance Use Topics  . Alcohol use: Never    Frequency: Never  . Drug use: Never    Review of Systems Constitutional: No fever/chills Eyes: No visual changes. ENT: Positive for lip and gum swelling.  Cardiovascular: Denies chest pain. Respiratory: Denies shortness of breath. Gastrointestinal: No abdominal pain.  No nausea, no vomiting.  No diarrhea.   Genitourinary: Negative for dysuria. Musculoskeletal: Negative for back pain. Skin: Negative for rash. Neurological: Negative for headaches, focal weakness or numbness.  ____________________________________________   PHYSICAL EXAM:  VITAL SIGNS: ED Triage Vitals  Enc Vitals Group     BP 10/08/18 1428 (!) 158/116  Pulse Rate 10/08/18 1428 (!) 105     Resp 10/08/18 1428 20     Temp 10/08/18 1428 100 F (37.8 C)     Temp Source 10/08/18 1428 Oral     SpO2 10/08/18 1738 98 %     Weight 10/08/18 1429 273 lb (123.8 kg)     Height 10/08/18 1429 5\' 11"  (1.803 m)     Head Circumference --      Peak Flow --      Pain Score 10/08/18 1429 0   Constitutional: Alert and oriented.  Eyes: Conjunctivae are normal.  ENT      Head: Normocephalic and  atraumatic.      Nose: No congestion/rhinnorhea.      Mouth/Throat: Poor dentition, swelling noted to upper mid gum.       Neck: No stridor. Hematological/Lymphatic/Immunilogical: No cervical lymphadenopathy. Cardiovascular: Normal rate, regular rhythm.  No murmurs, rubs, or gallops.  Respiratory: Normal respiratory effort without tachypnea nor retractions. Breath sounds are clear and equal bilaterally. No wheezes/rales/rhonchi. Gastrointestinal: Soft and non tender. No rebound. No guarding.  Genitourinary: Deferred Musculoskeletal: Normal range of motion in all extremities.  Neurologic:  Normal speech and language. No gross focal neurologic deficits are appreciated.  Skin:  Skin is warm, dry and intact. No rash noted. Psychiatric: Mood and affect are normal. Speech and behavior are normal. Patient exhibits appropriate insight and judgment.  ____________________________________________    LABS (pertinent positives/negatives)  CBC wbc 10.0, hgb 13.6, plt 271 CMP wnl except glu 114, cr 1.25  ____________________________________________   EKG  None  ____________________________________________    RADIOLOGY  None ____________________________________________   PROCEDURES  Procedures  ____________________________________________   INITIAL IMPRESSION / ASSESSMENT AND PLAN / ED COURSE  Pertinent labs & imaging results that were available during my care of the patient were reviewed by me and considered in my medical decision making (see chart for details).   Patient presented to the emergency department today because of concern for upper lip and gum swelling. On exam patient does have some poor dentition and swelling to the upper gum. It is tender. At this point do have concern for dental infection. Doubt angioedema or allergic reaction. Will plan on prescribing antibiotics. Discussed follow up with dentistry with patient.    ____________________________________________   FINAL CLINICAL IMPRESSION(S) / ED DIAGNOSES  Final diagnoses:  Dental infection     Note: This dictation was prepared with Dragon dictation. Any transcriptional errors that result from this process are unintentional     Phineas SemenGoodman, Carnesha Maravilla, MD 10/08/18 1849

## 2018-11-20 ENCOUNTER — Ambulatory Visit (INDEPENDENT_AMBULATORY_CARE_PROVIDER_SITE_OTHER): Payer: Medicare Other | Admitting: Nurse Practitioner

## 2018-11-20 ENCOUNTER — Encounter: Payer: Self-pay | Admitting: Nurse Practitioner

## 2018-11-20 ENCOUNTER — Other Ambulatory Visit: Payer: Self-pay

## 2018-11-20 DIAGNOSIS — K219 Gastro-esophageal reflux disease without esophagitis: Secondary | ICD-10-CM

## 2018-11-20 DIAGNOSIS — I10 Essential (primary) hypertension: Secondary | ICD-10-CM | POA: Diagnosis not present

## 2018-11-20 DIAGNOSIS — K5909 Other constipation: Secondary | ICD-10-CM | POA: Diagnosis not present

## 2018-11-20 MED ORDER — AMLODIPINE BESYLATE 10 MG PO TABS: 10.0000 mg | ORAL_TABLET | Freq: Every day | ORAL | 5 refills | Status: DC

## 2018-11-20 MED ORDER — OMEPRAZOLE 40 MG PO CPDR: DELAYED_RELEASE_CAPSULE | ORAL | 10 refills | Status: DC

## 2018-11-20 MED ORDER — POLYETHYLENE GLYCOL 3350 17 GM/SCOOP PO POWD: 8.5000 g | ORAL | 1 refills | Status: AC

## 2018-11-20 NOTE — Progress Notes (Signed)
Subjective:    Patient ID: Jonathan Cabrera, male    DOB: May 22, 1970, 48 y.o.   MRN: [id number]  Jonathan Cabrera is a 48 y.o. male presenting on 11/20/2018 for Constipation and Hypertension   HPI Hypertension - He is not checking BP at home or outside of clinic.    - Current medications: amlodipine 10 mg once daily, tolerating well without side effects - He is not currently symptomatic. - Pt denies headache, lightheadedness, dizziness, changes in vision, chest tightness/pressure, palpitations, leg swelling, sudden loss of speech or loss of consciousness. - He  reports no regular exercise routine. - His diet is high in salt, high in fat, and high in carbohydrates.   Constipation Drinks plenty of water, almost Gallon of tea at restaurant - this helped him to go to the bathroom.  Now states he drinks diet pepsi.   Feels bloated at end of the day.   - occasionally feels pain, but cannot go to the bathroom. - Patient has only a few teeth remaining.  13 teeth extracted 1 month ago. Now is limited to soft diet  Social History   Tobacco Use  . Smoking status: Never Smoker  . Smokeless tobacco: Never Used  Substance Use Topics  . Alcohol use: Never    Frequency: Never  . Drug use: Never    Review of Systems Per HPI unless specifically indicated above     Objective:    BP 128/76 (BP Location: Right Arm, Patient Position: Sitting, Cuff Size: Large)   Pulse 82   Ht 5\' 11"  (1.803 m)   Wt 278 lb 12.8 oz (126.5 kg)   SpO2 99%   BMI 38.88 kg/m   Wt Readings from Last 3 Encounters:  11/20/18 278 lb 12.8 oz (126.5 kg)  10/08/18 273 lb (123.8 kg)  06/03/18 281 lb 6.4 oz (127.6 kg)    Physical Exam Vitals signs reviewed.  Constitutional:      General: He is not in acute distress.    Appearance: Normal appearance. He is well-developed. He is obese.  HENT:     Head: Normocephalic and atraumatic.  Cardiovascular:     Rate and Rhythm: Normal rate and regular rhythm.     Pulses:          Radial pulses are 2+ on the right side and 2+ on the left side.       Posterior tibial pulses are 1+ on the right side and 1+ on the left side.     Heart sounds: Normal heart sounds, S1 normal and S2 normal.  Pulmonary:     Effort: Pulmonary effort is normal. No respiratory distress.     Breath sounds: Normal breath sounds and air entry.  Abdominal:     General: Abdomen is protuberant. Bowel sounds are normal. There is distension.     Palpations: Abdomen is soft. There is no shifting dullness, hepatomegaly or splenomegaly.     Tenderness: There is no abdominal tenderness. Negative signs include Murphy's sign and McBurney's sign.     Hernia: No hernia is present.  Musculoskeletal:     Right lower leg: No edema.     Left lower leg: No edema.  Skin:    General: Skin is warm and dry.     Capillary Refill: Capillary refill takes less than 2 seconds.  Neurological:     Mental Status: He is alert and oriented to person, place, and time. Mental status is at baseline.  Psychiatric:  Attention and Perception: Attention normal.        Mood and Affect: Mood and affect normal.        Behavior: Behavior normal. Behavior is cooperative.    Results for orders placed or performed during the hospital encounter of 10/08/18  CBC  Result Value Ref Range   WBC 10.0 4.0 - 10.5 K/uL   RBC 5.21 4.22 - 5.81 MIL/uL   Hemoglobin 13.6 13.0 - 17.0 g/dL   HCT 16.142.4 09.639.0 - 04.552.0 %   MCV 81.4 80.0 - 100.0 fL   MCH 26.1 26.0 - 34.0 pg   MCHC 32.1 30.0 - 36.0 g/dL   RDW 40.914.2 81.111.5 - 91.415.5 %   Platelets 271 150 - 400 K/uL   nRBC 0.0 0.0 - 0.2 %  Comprehensive metabolic panel  Result Value Ref Range   Sodium 138 135 - 145 mmol/L   Potassium 3.7 3.5 - 5.1 mmol/L   Chloride 108 98 - 111 mmol/L   CO2 23 22 - 32 mmol/L   Glucose, Bld 114 (H) 70 - 99 mg/dL   BUN 7 6 - 20 mg/dL   Creatinine, Ser 7.821.25 (H) 0.61 - 1.24 mg/dL   Calcium 9.5 8.9 - 95.610.3 mg/dL   Total Protein 7.9 6.5 - 8.1 g/dL   Albumin 4.4 3.5  - 5.0 g/dL   AST 18 15 - 41 U/L   ALT 18 0 - 44 U/L   Alkaline Phosphatase 109 38 - 126 U/L   Total Bilirubin 0.8 0.3 - 1.2 mg/dL   GFR calc non Af Amer >60 >60 mL/min   GFR calc Af Amer >60 >60 mL/min   Anion gap 7 5 - 15      Assessment & Plan:   Problem List Items Addressed This Visit      Cardiovascular and Mediastinum   Essential hypertension Controlled hypertension.  BP goal < 130/80.  Pt is not working on lifestyle modifications.  Taking medications tolerating well without side effects. Complications: obesity  Plan: 1. Continue taking amlodipine 10 mg once daily 2. Reviewed recent labs.  Normal BUN.  Cr elevated likely due to dehydration at that time.  3. Encouraged heart healthy diet and increasing exercise to 30 minutes most days of the week. 4. Check BP 1-2 x per week at home, keep log, and bring to clinic at next appointment. 5. Follow up 6 months.     Relevant Medications   amLODipine (NORVASC) 10 MG tablet     Digestive   Gastroesophageal reflux disease Currently well controlled on omeprazole 20 mg once daily.  Plan: 1. Continue omeprazole 20 mg once daily.  2. Avoid diet triggers. Reviewed need to seek care if globus sensation, difficulty swallowing, s/sx of GI bleed. 3. Follow up as needed and in 6 mos.    Relevant Medications   polyethylene glycol powder (GLYCOLAX/MIRALAX) 17 GM/SCOOP powder (Start on 11/21/2018)   omeprazole (PRILOSEC) 40 MG capsule    Other Visit Diagnoses    Other constipation     Patient describes constipation, however actual stool description is that of diarrhea with watery BM.    Plan: 1. [facility] for 2-3 weeks.  If not improving, then try none. Then, if needed GI referral.  Francena HanlyStella to call clinic for further orders [facility] is not improving symptoms 2. Encouraged patient to begin consuming less sugar - suspect bloating and symptoms due to high sugar diet. 3. Follow-up 2-4 weeks  prn.  Relevant Medications   polyethylene glycol powder (GLYCOLAX/MIRALAX) 17 GM/SCOOP powder (Start on 11/21/2018)      Also completed resident care plan form today.  Annual update required.  Meds ordered this encounter  Medications  . polyethylene glycol powder (GLYCOLAX/MIRALAX) 17 GM/SCOOP powder    Sig: Take 8.5 g by mouth every Monday, Wednesday, and Friday.    Dispense:  3350 g    Refill:  1    Please relabel current supply bottle - per Francena HanlyStella    Order Specific Question:   Supervising Provider    Answer:   Smitty CordsKARAMALEGOS, ALEXANDER J [[id number]]  . amLODipine (NORVASC) 10 MG tablet    Sig: Take 1 tablet (10 mg total) by mouth daily.    Dispense:  30 tablet    Refill:  5    Order Specific Question:   Supervising Provider    Answer:   Smitty CordsKARAMALEGOS, ALEXANDER J [[id number]]  . omeprazole (PRILOSEC) 40 MG capsule    Sig: TAKE ONE CAPSULE BY MOUTH EVERY DAY *DO NOT CRUSH*    Dispense:  30 capsule    Refill:  10    Order Specific Question:   Supervising Provider    Answer:   Smitty CordsKARAMALEGOS, ALEXANDER J [[id number]]    Follow up plan: Return in about 6 months (around 05/23/2019) for hypertension AND sooner for constipation if needed.  Wilhelmina McardleLauren Traycen Goyer, DNP, AGPCNP-BC Adult Gerontology Primary Care Nurse Practitioner [facility] 11/20/2018, 11:12 AM

## 2018-11-20 NOTE — Patient Instructions (Addendum)
[place] [facility] geologist,   Thank you for coming in to clinic today.  1. CHANGE Miralax to Monday, Wednesday, and Friday. - Call clinic 2-3 weeks after this change.  Let me know if bowel movements are still very watery/mushy.  Please schedule a follow-up appointment with Cassell Smiles, AGNP. Return in about 6 months (around 05/23/2019) for hypertension AND sooner for constipation if needed.  If you have any other questions or concerns, please feel free to call the clinic or send a message through [employer]. You may also schedule an earlier appointment if necessary.  You will receive a survey after today's visit either digitally by e-mail or paper by [employer]. Your experiences and feedback matter to [place].  Please respond so we know how we are doing as we provide care for you.   Cassell Smiles, DNP, AGNP-BC Adult Gerontology Nurse Practitioner [place]

## 2018-11-25 ENCOUNTER — Ambulatory Visit (INDEPENDENT_AMBULATORY_CARE_PROVIDER_SITE_OTHER): Payer: Medicare Other

## 2018-11-25 VITALS — Ht 71.0 in | Wt 278.8 lb

## 2018-11-25 DIAGNOSIS — Z0000 Encounter for general adult medical examination without abnormal findings: Secondary | ICD-10-CM

## 2018-11-25 NOTE — Patient Instructions (Signed)
Jonathan Cabrera , Thank you for taking time to come for your Medicare Wellness Visit. I appreciate your ongoing commitment to your health goals. Please review the following plan we discussed and let me know if I can assist you in the future.   Screening recommendations/referrals: Colonoscopy: not indicated  Recommended yearly ophthalmology/optometry visit for glaucoma screening and checkup Recommended yearly dental visit for hygiene and checkup  Vaccinations: Influenza vaccine: due 12/2018 Pneumococcal vaccine: not indicated Tdap vaccine: due, check with your insurance for coverage  Shingles vaccine: not indicated    Advanced directives: please pick up a copy of this information next time you are in the office   Conditions/risks identified: please let us know if you need anything   Next appointment: follow up in one year for your annual wellness visit.   Preventive Care 40-64 Years, Male Preventive care refers to lifestyle choices and visits with your health care provider that can promote health and wellness. What does preventive care include?  A yearly physical exam. This is also called an annual well check.  Dental exams once or twice a year.  Routine eye exams. Ask your health care provider how often you should have your eyes checked.  Personal lifestyle choices, including:  Daily care of your teeth and gums.  Regular physical activity.  Eating a healthy diet.  Avoiding tobacco and drug use.  Limiting alcohol use.  Practicing safe sex.  Taking low-dose aspirin every day starting at age 58. What happens during an annual well check? The services and screenings done by your health care provider during your annual well check will depend on your age, overall health, lifestyle risk factors, and family history of disease. Counseling  Your health care provider may ask you questions about your:  Alcohol use.  Tobacco use.  Drug use.  Emotional well-being.  Home and  relationship well-being.  Sexual activity.  Eating habits.  Work and work Statistician. Screening  You may have the following tests or measurements:  Height, weight, and BMI.  Blood pressure.  Lipid and cholesterol levels. These may be checked every 5 years, or more frequently if you are over 21 years old.  Skin check.  Lung cancer screening. You may have this screening every year starting at age 61 if you have a 30-pack-year history of smoking and currently smoke or have quit within the past 15 years.  Fecal occult blood test (FOBT) of the stool. You may have this test every year starting at age 73.  Flexible sigmoidoscopy or colonoscopy. You may have a sigmoidoscopy every 5 years or a colonoscopy every 10 years starting at age 68.  Prostate cancer screening. Recommendations will vary depending on your family history and other risks.  Hepatitis C blood test.  Hepatitis B blood test.  Sexually transmitted disease (STD) testing.  Diabetes screening. This is done by checking your blood sugar (glucose) after you have not eaten for a while (fasting). You may have this done every 1-3 years. Discuss your test results, treatment options, and if necessary, the need for more tests with your health care provider. Vaccines  Your health care provider may recommend certain vaccines, such as:  Influenza vaccine. This is recommended every year.  Tetanus, diphtheria, and acellular pertussis (Tdap, Td) vaccine. You may need a Td booster every 10 years.  Zoster vaccine. You may need this after age 64.  Pneumococcal 13-valent conjugate (PCV13) vaccine. You may need this if you have certain conditions and have not been vaccinated.  Pneumococcal  polysaccharide (PPSV23) vaccine. You may need one or two doses if you smoke cigarettes or if you have certain conditions. Talk to your health care provider about which screenings and vaccines you need and how often you need them. This information is  not intended to replace advice given to you by your health care provider. Make sure you discuss any questions you have with your health care provider. Document Released: 04/29/2015 Document Revised: 12/21/2015 Document Reviewed: 02/01/2015 Elsevier Interactive Patient Education  2017 [place] Prevention in the Home Falls can cause injuries. They can happen to people of all ages. There are many things you can do to make your home safe and to help prevent falls. What can I do on the outside of my home?  Regularly fix the edges of walkways and driveways and fix any cracks.  Remove anything that might make you trip as you walk through a door, such as a raised step or threshold.  Trim any bushes or trees on the path to your home.  Use bright outdoor lighting.  Clear any walking paths of anything that might make someone trip, such as rocks or tools.  Regularly check to see if handrails are loose or broken. Make sure that both sides of any steps have handrails.  Any raised decks and porches should have guardrails on the edges.  Have any leaves, snow, or ice cleared regularly.  Use sand or salt on walking paths during winter.  Clean up any spills in your garage right away. This includes oil or grease spills. What can I do in the bathroom?  Use night lights.  Install grab bars by the toilet and in the tub and shower. Do not use towel bars as grab bars.  Use non-skid mats or decals in the tub or shower.  If you need to sit down in the shower, use a plastic, non-slip stool.  Keep the floor dry. Clean up any water that spills on the floor as soon as it happens.  Remove soap buildup in the tub or shower regularly.  Attach bath mats securely with double-sided non-slip rug tape.  Do not have throw rugs and other things on the floor that can make you trip. What can I do in the bedroom?  Use night lights.  Make sure that you have a light by your bed that is easy to reach.   Do not use any sheets or blankets that are too big for your bed. They should not hang down onto the floor.  Have a firm chair that has side arms. You can use this for support while you get dressed.  Do not have throw rugs and other things on the floor that can make you trip. What can I do in the kitchen?  Clean up any spills right away.  Avoid walking on wet floors.  Keep items that you use a lot in easy-to-reach places.  If you need to reach something above you, use a strong step stool that has a grab bar.  Keep electrical cords out of the way.  Do not use floor polish or wax that makes floors slippery. If you must use wax, use non-skid floor wax.  Do not have throw rugs and other things on the floor that can make you trip. What can I do with my stairs?  Do not leave any items on the stairs.  Make sure that there are handrails on both sides of the stairs and use them. Fix handrails that are  broken or loose. Make sure that handrails are as long as the stairways.  Check any carpeting to make sure that it is firmly attached to the stairs. Fix any carpet that is loose or worn.  Avoid having throw rugs at the top or bottom of the stairs. If you do have throw rugs, attach them to the floor with carpet tape.  Make sure that you have a light switch at the top of the stairs and the bottom of the stairs. If you do not have them, ask someone to add them for you. What else can I do to help prevent falls?  Wear shoes that:  Do not have high heels.  Have rubber bottoms.  Are comfortable and fit you well.  Are closed at the toe. Do not wear sandals.  If you use a stepladder:  Make sure that it is fully opened. Do not climb a closed stepladder.  Make sure that both sides of the stepladder are locked into place.  Ask someone to hold it for you, if possible.  Clearly mark and make sure that you can see:  Any grab bars or handrails.  First and last steps.  Where the edge of  each step is.  Use tools that help you move around (mobility aids) if they are needed. These include:  Canes.  Walkers.  Scooters.  Crutches.  Turn on the lights when you go into a dark area. Replace any light bulbs as soon as they burn out.  Set up your furniture so you have a clear path. Avoid moving your furniture around.  If any of your floors are uneven, fix them.  If there are any pets around you, be aware of where they are.  Review your medicines with your doctor. Some medicines can make you feel dizzy. This can increase your chance of falling. Ask your doctor what other things that you can do to help prevent falls. This information is not intended to replace advice given to you by your health care provider. Make sure you discuss any questions you have with your health care provider. Document Released: 01/27/2009 Document Revised: 09/08/2015 Document Reviewed: 05/07/2014 Elsevier Interactive Patient Education  2017 [employer].

## 2018-11-25 NOTE — Progress Notes (Signed)
Subjective:   Jonathan Cabrera is a 48 y.o. male who presents for Medicare Annual/Subsequent preventive examination.  This visit is being conducted via phone call  - after an attmept to do on video chat - due to the COVID-19 pandemic. This patient has given me verbal consent via phone to conduct this visit, patient states they are participating from their home address. Some vital signs may be absent or patient reported.   Patient identification: identified by name, DOB, and current address.    Review of Systems:   Cardiac Risk Factors include: male gender;hypertension     Objective:    Vitals: Ht 5\' 11"  (1.803 m)   Wt 278 lb 12.8 oz (126.5 kg)   BMI 38.88 kg/m   Body mass index is 38.88 kg/m.  Advanced Directives 11/25/2018 10/08/2018 04/28/2018  Does Patient Have a Medical Advance Directive? No No No    Tobacco Social History   Tobacco Use  Smoking Status Never Smoker  Smokeless Tobacco Never Used     Counseling given: Not Answered   Clinical Intake:  Pre-visit preparation completed: Yes  Pain : No/denies pain     Nutritional Risks: None  How often do you need to have someone help you when you read instructions, pamphlets, or other written materials from your doctor or pharmacy?: 1 - Never  Interpreter Needed?: No  Information entered by :: Charo Philipp,LPN  Past Medical History:  Diagnosis Date  . Anxiety   . Bipolar 1 disorder (HCC)   . Depression   . GERD (gastroesophageal reflux disease)   . Schizoaffective disorder (HCC)   . TBI (traumatic brain injury) [facility](HCC)    History reviewed. No pertinent surgical history. History reviewed. No pertinent family history. Social History   Socioeconomic History  . Marital status: Single    Spouse name: Not on file  . Number of children: Not on file  . Years of education: Not on file  . Highest education level: Not on file  Occupational History  . Not on file  Social Needs  . Financial resource strain: Not  on file  . Food insecurity    Worry: Not on file    Inability: Not on file  . Transportation needs    Medical: Not on file    Non-medical: Not on file  Tobacco Use  . Smoking status: Never Smoker  . Smokeless tobacco: Never Used  Substance and Sexual Activity  . Alcohol use: Never    Frequency: Never  . Drug use: Never  . Sexual activity: Not on file  Lifestyle  . Physical activity    Days per week: Not on file    Minutes per session: Not on file  . Stress: Not on file  Relationships  . Social Musicianconnections    Talks on phone: Not on file    Gets together: Not on file    Attends religious service: Not on file    Active member of club or organization: Not on file    Attends meetings of clubs or organizations: Not on file    Relationship status: Not on file  Other Topics Concern  . Not on file  Social History Narrative  . Not on file    Outpatient Encounter Medications as of 11/25/2018  Medication Sig  . amLODipine (NORVASC) 10 MG tablet Take 1 tablet (10 mg total) by mouth daily.  . benztropine (COGENTIN) 1 MG tablet Take 1 mg by mouth 2 (two) times daily.  . fluPHENAZine (PROLIXIN) 5 MG  tablet Take 10 mg by mouth daily. In am  . fluPHENAZine (PROLIXIN) 5 MG tablet Take 15 mg by mouth at bedtime.   Marland Kitchen lithium carbonate (LITHOBID) 300 MG CR tablet Take 600 mg by mouth 2 (two) times daily.  . Melatonin 3 MG TABS Take 2 tablets by mouth at bedtime.   Marland Kitchen omeprazole (PRILOSEC) 40 MG capsule TAKE ONE CAPSULE BY MOUTH EVERY DAY *DO NOT CRUSH*  . polyethylene glycol powder (GLYCOLAX/MIRALAX) 17 GM/SCOOP powder Take 8.5 g by mouth every Monday, Wednesday, and Friday.  Marland Kitchen QUEtiapine (SEROQUEL) 100 MG tablet Take 100 mg by mouth 2 (two) times daily.  . QUEtiapine (SEROQUEL) 300 MG tablet Take 300 mg by mouth at bedtime.  . hydrOXYzine (ATARAX/VISTARIL) 25 MG tablet Take 25 mg by mouth daily as needed.   No facility-administered encounter medications on file as of 11/25/2018.      Activities of Daily Living In your present state of health, do you have any difficulty performing the following activities: 11/25/2018 11/20/2018  Hearing? N N  Vision? N N  Comment no glasses -  Difficulty concentrating or making decisions? Tempie Donning  Walking or climbing stairs? N N  Dressing or bathing? N N  Doing errands, shopping? Aggie Moats  Comment facility assists -  Preparing Food and eating ? N -  Using the Toilet? N -  In the past six months, have you accidently leaked urine? N -  Do you have problems with loss of bowel control? N -  Managing your Medications? Y -  Comment caretaker assists -  Managing your Finances? Y -  Comment caretaker assists -  Housekeeping or managing your Housekeeping? Y -  Comment caretaker assists -  Some recent data might be hidden    Patient Care Team: Mikey College, NP as PCP - General (Nurse Practitioner)   Assessment:   This is a routine wellness examination for Ragnar.  Exercise Activities and Dietary recommendations Current Exercise Habits: The patient does not participate in regular exercise at present, Exercise limited by: None identified  Goals   None     Fall Risk: Fall Risk  11/25/2018  Falls in the past year? 0    FALL RISK PREVENTION PERTAINING TO THE HOME:  Any stairs in or around the home? No  If so, are there any without handrails? No   Home free of loose throw rugs in walkways, pet beds, electrical cords, etc? Yes  Adequate lighting in your home to reduce risk of falls? Yes   ASSISTIVE DEVICES UTILIZED TO PREVENT FALLS:  Life alert? No  Use of a cane, walker or w/c? No  Grab bars in the bathroom? Yes  Shower chair or bench in shower? No  Elevated toilet seat or a handicapped toilet? No   TIMED UP AND GO:  Unable to perform   Depression Screen PHQ 2/9 Scores 11/25/2018  PHQ - 2 Score 1  PHQ- 9 Score 1    Cognitive Function        Immunization History  Administered Date(s) Administered  .  Influenza,inj,Quad PF,6+ Mos 03/05/2018    Qualifies for Shingles Vaccine? No    Tdap: Discussed need for TD/TDAP vaccine, patient verbalized understanding that this is not covered as a preventative with there insurance and to call the office if he develops any new skin injuries, ie: cuts, scrapes, bug bites, or open wounds.  Flu Vaccine: Due 12/2018  Pneumococcal Vaccine: not indicated   Screening Tests Health Maintenance  Topic Date  Due  . HIV Screening  01/12/1988  . INFLUENZA VACCINE  11/15/2018  . TETANUS/TDAP  11/25/2019 (Originally 01/12/1992)   Cancer Screenings:  Colorectal Screening: not indicated   Lung Cancer Screening: (Low Dose CT Chest recommended if Age 5-80 years, 30 pack-year currently smoking OR have quit w/in 15years.) does not qualify.    Additional Screening:  Hepatitis C Screening: does not qualify  Vision Screening: Recommended annual ophthalmology exams for early detection of glaucoma and other disorders of the eye. Is the patient up to date with their annual eye exam?  No     Dental Screening: Recommended annual dental exams for proper oral hygiene  Community Resource Referral:  CRR required this visit?  No        Plan:  I have personally reviewed and addressed the Medicare Annual Wellness questionnaire and have noted the following in the patient's chart:  A. Medical and social history B. Use of alcohol, tobacco or illicit drugs  C. Current medications and supplements D. Functional ability and status E.  Nutritional status F.  Physical activity G. Advance directives H. List of other physicians I.  Hospitalizations, surgeries, and ER visits in previous 12 months J.  Vitals K. Screenings such as hearing and vision if needed, cognitive and depression L. Referrals and appointments   In addition, I have reviewed and discussed with patient certain preventive protocols, quality metrics, and best practice recommendations. A written personalized  care plan for preventive services as well as general preventive health recommendations were provided to patient.   Signed,   Collene SchlichterHill, Majestic Molony A, LPN  [id number]/02/2019 Nurse Health Advisor   Nurse Notes: none

## 2019-04-29 ENCOUNTER — Other Ambulatory Visit: Payer: Self-pay

## 2019-04-29 ENCOUNTER — Encounter: Payer: Self-pay | Admitting: Family Medicine

## 2019-04-29 ENCOUNTER — Ambulatory Visit (INDEPENDENT_AMBULATORY_CARE_PROVIDER_SITE_OTHER): Payer: Medicare Other | Admitting: Family Medicine

## 2019-04-29 VITALS — BP 128/85 | HR 88 | Temp 98.7°F | Resp 16 | Ht 71.0 in | Wt 266.0 lb

## 2019-04-29 DIAGNOSIS — I10 Essential (primary) hypertension: Secondary | ICD-10-CM | POA: Diagnosis not present

## 2019-04-29 DIAGNOSIS — F319 Bipolar disorder, unspecified: Secondary | ICD-10-CM

## 2019-04-29 DIAGNOSIS — F259 Schizoaffective disorder, unspecified: Secondary | ICD-10-CM

## 2019-04-29 DIAGNOSIS — K219 Gastro-esophageal reflux disease without esophagitis: Secondary | ICD-10-CM | POA: Diagnosis not present

## 2019-04-29 MED ORDER — AMLODIPINE BESYLATE 10 MG PO TABS: 10.0000 mg | ORAL_TABLET | Freq: Every day | ORAL | 1 refills | Status: DC

## 2019-04-29 NOTE — Progress Notes (Signed)
Subjective:    Patient ID: Jonathan Cabrera, male    DOB: 26-Oct-1970, 49 y.o.   MRN: [id number]  [name] is a 49 y.o. male presenting on 04/29/2019 for Hypertension (FL2 forms)  Previous PCP Wilhelmina Mcardle, AGPCNP-BC   Here today with Francena Hanly from [facility], who provides additional history.  Due for [facility] renewal signed form.  HPI   CHRONIC HTN: Reports no new concerns. Doing well on med. Current Meds - Amlodipine 10mg  daily   Reports good compliance, took meds today. Tolerating well, w/o complaints. Denies CP, dyspnea, HA, edema, dizziness / lightheadedness  Obesity BMI >37 Weight down to 266 lbs. Previously 278 lbs. Walking some. Not daily.  Schizophrenia, chronic He has upcoming 05/05/19 telemedicine apt. Psychiatry Dr 05/07/19 at [facility] on current med regimen - Fluphenazine, Lithium, Quetiapine, Benztropine, Hydroxyzine. Managed by Psychiatry.  GERD Chronic problem. Heartburn / GERD. Taking Omeparzole 40mg  daily with good results.  Constipation, chronic Taking miralax powder. 8.5g dose M -W- F. He has had episodic constipation, up to no BM for 3-4 days in past. Now improved on current miralax dose. Had more regular dosing before with runny or loose BM.  Health Maintenance: Due for Flu Shot, declines today despite counseling on benefits   Depression screen [facility] 2/9 04/29/2019 11/25/2018  Decreased Interest 0 0  Down, Depressed, Hopeless 0 1  PHQ - 2 Score 0 1  Altered sleeping - 0  Tired, decreased energy - 0  Change in appetite - 0  Feeling bad or failure about yourself  - 0  Trouble concentrating - 0  Moving slowly or fidgety/restless - 0  Suicidal thoughts - 0  PHQ-9 Score - 1  Difficult doing work/chores - Not difficult at all    Past Medical History:  Diagnosis Date  . Anxiety   . Bipolar 1 disorder (HCC)   . Depression   . GERD (gastroesophageal reflux disease)   . Schizoaffective disorder (HCC)   . TBI  (traumatic brain injury) [facility])    History reviewed. No pertinent surgical history. Social History   Socioeconomic History  . Marital status: Single    Spouse name: Not on file  . Number of children: Not on file  . Years of education: Not on file  . Highest education level: Not on file  Occupational History  . Not on file  Tobacco Use  . Smoking status: Never Smoker  . Smokeless tobacco: Never Used  Substance and Sexual Activity  . Alcohol use: Never  . Drug use: Never  . Sexual activity: Not on file  Other Topics Concern  . Not on file  Social History Narrative  . Not on file   Social Determinants of Health   Financial Resource Strain:   . Difficulty of Paying Living Expenses: Not on file  Food Insecurity:   . Worried About 01/25/2019 in the Last Year: Not on file  . Ran Out of Food in the Last Year: Not on file  Transportation Needs:   . Lack of Transportation (Medical): Not on file  . Lack of Transportation (Non-Medical): Not on file  Physical Activity:   . Days of Exercise per Week: Not on file  . Minutes of Exercise per Session: Not on file  Stress:   . Feeling of Stress : Not on file  Social Connections:   . Frequency of Communication with Friends and Family: Not on file  . Frequency of Social Gatherings with Friends and Family:  Not on file  . Attends Religious Services: Not on file  . Active Member of Clubs or Organizations: Not on file  . Attends Banker Meetings: Not on file  . Marital Status: Not on file  Intimate Partner Violence:   . Fear of Current or Ex-Partner: Not on file  . Emotionally Abused: Not on file  . Physically Abused: Not on file  . Sexually Abused: Not on file   History reviewed. No pertinent family history. Current Outpatient Medications on File Prior to Visit  Medication Sig  . benztropine (COGENTIN) 1 MG tablet Take 1 mg by mouth 2 (two) times daily.  . fluPHENAZine (PROLIXIN) 5 MG tablet Take 10 mg by mouth  daily. In am  . fluPHENAZine (PROLIXIN) 5 MG tablet Take 15 mg by mouth at bedtime.   . hydrOXYzine (ATARAX/VISTARIL) 25 MG tablet Take 25 mg by mouth daily as needed.  . lithium carbonate (LITHOBID) 300 MG CR tablet Take 600 mg by mouth 2 (two) times daily.  . Melatonin 3 MG TABS Take 2 tablets by mouth at bedtime.   Marland Kitchen omeprazole (PRILOSEC) 40 MG capsule TAKE ONE CAPSULE BY MOUTH EVERY DAY *DO NOT CRUSH*  . polyethylene glycol powder (GLYCOLAX/MIRALAX) 17 GM/SCOOP powder Take 8.5 g by mouth every Monday, Wednesday, and Friday.  Marland Kitchen QUEtiapine (SEROQUEL) 100 MG tablet Take 100 mg by mouth 2 (two) times daily.  . QUEtiapine (SEROQUEL) 300 MG tablet Take 300 mg by mouth at bedtime.   No current facility-administered medications on file prior to visit.    Review of Systems  Constitutional: Negative for activity change, appetite change, chills, diaphoresis, fatigue and fever.  HENT: Negative for congestion and hearing loss.   Eyes: Negative for visual disturbance.  Respiratory: Negative for cough, chest tightness, shortness of breath and wheezing.   Cardiovascular: Negative for chest pain, palpitations and leg swelling.  Gastrointestinal: Negative for abdominal pain, anal bleeding, blood in stool, constipation, diarrhea, nausea and vomiting.  Endocrine: Negative for cold intolerance.  Genitourinary: Negative for dysuria, frequency and hematuria.  Musculoskeletal: Negative for arthralgias, back pain and neck pain.  Skin: Negative for rash.  Allergic/Immunologic: Negative for environmental allergies.  Neurological: Negative for dizziness, weakness, light-headedness, numbness and headaches.  Hematological: Negative for adenopathy.  Psychiatric/Behavioral: Negative for behavioral problems, dysphoric mood and sleep disturbance. The patient is not nervous/anxious.    Per HPI unless specifically indicated above      Objective:    BP 128/85   Pulse 88   Temp 98.7 F (37.1 C) (Oral)   Resp 16    Ht 5\' 11"  (1.803 m)   Wt 266 lb (120.7 kg)   BMI 37.10 kg/m   Wt Readings from Last 3 Encounters:  04/29/19 266 lb (120.7 kg)  11/25/18 278 lb 12.8 oz (126.5 kg)  11/20/18 278 lb 12.8 oz (126.5 kg)    Physical Exam Vitals and nursing note reviewed.  Constitutional:      General: He is not in acute distress.    Appearance: He is well-developed. He is not diaphoretic.     Comments: Well-appearing, comfortable, cooperative, obese  HENT:     Head: Normocephalic and atraumatic.  Eyes:     General:        Right eye: No discharge.        Left eye: No discharge.     Conjunctiva/sclera: Conjunctivae normal.     Pupils: Pupils are equal, round, and reactive to light.  Neck:     Thyroid:  No thyromegaly.  Cardiovascular:     Rate and Rhythm: Normal rate and regular rhythm.     Heart sounds: Normal heart sounds. No murmur.  Pulmonary:     Effort: Pulmonary effort is normal. No respiratory distress.     Breath sounds: Normal breath sounds. No wheezing or rales.  Abdominal:     General: Bowel sounds are normal. There is no distension.     Palpations: Abdomen is soft. There is no mass.     Tenderness: There is no abdominal tenderness.  Musculoskeletal:        General: No tenderness. Normal range of motion.     Cervical back: Normal range of motion and neck supple.     Comments: Upper / Lower Extremities: - Normal muscle tone, strength bilateral upper extremities 5/5, lower extremities 5/5  Lymphadenopathy:     Cervical: No cervical adenopathy.  Skin:    General: Skin is warm and dry.     Findings: No erythema or rash.  Neurological:     Mental Status: He is alert and oriented to person, place, and time.     Comments: Distal sensation intact to light touch all extremities  Psychiatric:        Behavior: Behavior normal.     Comments: Well groomed, good eye contact, frequent speech with normal thoughts occasional tangential idea. No pressured speech. No abnormal behavior or  perceptions.    Results for orders placed or performed during the hospital encounter of 10/08/18  CBC  Result Value Ref Range   WBC 10.0 4.0 - 10.5 K/uL   RBC 5.21 4.22 - 5.81 MIL/uL   Hemoglobin 13.6 13.0 - 17.0 g/dL   HCT 42.4 39.0 - 52.0 %   MCV 81.4 80.0 - 100.0 fL   MCH 26.1 26.0 - 34.0 pg   MCHC 32.1 30.0 - 36.0 g/dL   RDW 14.2 11.5 - 15.5 %   Platelets 271 150 - 400 K/uL   nRBC 0.0 0.0 - 0.2 %  Comprehensive metabolic panel  Result Value Ref Range   Sodium 138 135 - 145 mmol/L   Potassium 3.7 3.5 - 5.1 mmol/L   Chloride 108 98 - 111 mmol/L   CO2 23 22 - 32 mmol/L   Glucose, Bld 114 (H) 70 - 99 mg/dL   BUN 7 6 - 20 mg/dL   Creatinine, Ser 1.25 (H) 0.61 - 1.24 mg/dL   Calcium 9.5 8.9 - 10.3 mg/dL   Total Protein 7.9 6.5 - 8.1 g/dL   Albumin 4.4 3.5 - 5.0 g/dL   AST 18 15 - 41 U/L   ALT 18 0 - 44 U/L   Alkaline Phosphatase 109 38 - 126 U/L   Total Bilirubin 0.8 0.3 - 1.2 mg/dL   GFR calc non Af Amer >60 >60 mL/min   GFR calc Af Amer >60 >60 mL/min   Anion gap 7 5 - 15      Assessment & Plan:   Problem List Items Addressed This Visit    Schizoaffective disorder, chronic condition (HCC)   Gastroesophageal reflux disease - Primary   Essential hypertension   Relevant Medications   amLODipine (NORVASC) 10 MG tablet   Bipolar 1 disorder (HCC)      Well-controlled HTN - Home BP readings reviewed   No known complications    Plan:  1.  Continue current BP regimen Amlodipine 10mg  daily, refilled 2. Encourage improved lifestyle - low sodium diet, regular exercise 3. Continue monitor BP outside office, bring readings  to next visit, if persistently >140/90 or new symptoms notify office sooner  ------  GERD Controlled on daily PPI Omeprazole 40mg  daily  -----  Schizophrenia / Bipolar Type 1 Followed by Psychiatry Dr at [facility] On med regimen currently reviewed per med rec, without changes today Next apt in 1 week telemedicine   Signed  FL2 for current family care home today.  Meds ordered this encounter  Medications  . amLODipine (NORVASC) 10 MG tablet    Sig: Take 1 tablet (10 mg total) by mouth daily.    Dispense:  90 tablet    Refill:  1      Follow up plan: Return in about 6 months (around 10/27/2019) for 6 month follow-up w/ new provider.  10/29/2019, DO [facility] 04/29/2019, 1:34 PM

## 2019-04-29 NOTE — Patient Instructions (Addendum)
Thank you for coming to the office today.  Signed FL2 today.  Please schedule a Follow-up Appointment to: Return in about 6 months (around 10/27/2019) for 6 month follow-up w/ new provider.  If you have any other questions or concerns, please feel free to call the office or send a message through MyChart. You may also schedule an earlier appointment if necessary.  Additionally, you may be receiving a survey about your experience at our office within a few days to 1 week by e-mail or mail. We value your feedback.  Saralyn Pilar, DO [facility], [place]

## 2019-05-28 ENCOUNTER — Ambulatory Visit: Payer: Medicare Other | Admitting: Nurse Practitioner

## 2019-09-24 ENCOUNTER — Other Ambulatory Visit: Payer: Self-pay | Admitting: Nurse Practitioner

## 2019-09-24 DIAGNOSIS — K219 Gastro-esophageal reflux disease without esophagitis: Secondary | ICD-10-CM

## 2019-09-24 NOTE — Telephone Encounter (Signed)
Requested Prescriptions  Pending Prescriptions Disp Refills  . omeprazole (PRILOSEC) 40 MG capsule [Pharmacy Med Name: Omeprazole 40 MG Capsule delayed release] 90 capsule 0    Sig: TAKE 1 CAPSULE BY MOUTH ONCE DAILY     Gastroenterology: Proton Pump Inhibitors Passed - 09/24/2019  9:57 AM      Passed - Valid encounter within last 12 months    Recent Outpatient Visits          4 months ago Gastroesophageal reflux disease, unspecified whether esophagitis present   [facility] Smitty Cords, DO   10 months ago Other constipation   [facility] Kyung Rudd, Alison Stalling, NP   1 year ago Ginette Pitman   [facility] Kyung Rudd, Alison Stalling, NP   1 year ago Essential hypertension   [facility] Galen Manila, NP   1 year ago Other constipation   [facility] Kyung Rudd, Alison Stalling, NP      Future Appointments            In 4 weeks Malfi, Jodelle Gross, FNP [facility]           Called the Group Home to sched. pt. for new pt. Appt.  Spoke with Francena Hanly.  Appt. Sched. For 10/22/19 @ 2:00 PM.  Refilled medication per protocol.

## 2019-10-22 ENCOUNTER — Other Ambulatory Visit: Payer: Self-pay

## 2019-10-22 ENCOUNTER — Ambulatory Visit: Payer: Self-pay | Admitting: Family Medicine

## 2019-10-22 ENCOUNTER — Encounter: Payer: Self-pay | Admitting: Family Medicine

## 2019-10-22 ENCOUNTER — Ambulatory Visit (INDEPENDENT_AMBULATORY_CARE_PROVIDER_SITE_OTHER): Payer: Medicare Other | Admitting: Family Medicine

## 2019-10-22 VITALS — BP 128/89 | HR 84 | Temp 97.1°F | Ht 71.0 in | Wt 278.2 lb

## 2019-10-22 DIAGNOSIS — R635 Abnormal weight gain: Secondary | ICD-10-CM | POA: Diagnosis not present

## 2019-10-22 DIAGNOSIS — R7309 Other abnormal glucose: Secondary | ICD-10-CM

## 2019-10-22 DIAGNOSIS — I10 Essential (primary) hypertension: Secondary | ICD-10-CM | POA: Diagnosis not present

## 2019-10-22 DIAGNOSIS — K219 Gastro-esophageal reflux disease without esophagitis: Secondary | ICD-10-CM

## 2019-10-22 DIAGNOSIS — Z79899 Other long term (current) drug therapy: Secondary | ICD-10-CM

## 2019-10-22 LAB — POCT GLYCOSYLATED HEMOGLOBIN (HGB A1C): Hemoglobin A1C: 5.3 % (ref 4.0–5.6)

## 2019-10-22 LAB — POCT URINALYSIS DIPSTICK
Bilirubin, UA: NEGATIVE
Blood, UA: NEGATIVE
Glucose, UA: NEGATIVE
Ketones, UA: NEGATIVE
Leukocytes, UA: NEGATIVE
Nitrite, UA: NEGATIVE
Protein, UA: NEGATIVE
Spec Grav, UA: <=1.005 — AB (ref 1.010–1.025)
Urobilinogen, UA: 0.2 E.U./dL
pH, UA: 5 (ref 5.0–8.0)

## 2019-10-22 NOTE — Patient Instructions (Signed)
As we discussed, try to limit your diet soda beverages to 2 per day.  Work on decreasing your processed food intake (chips, barbeque skins) and start swapping out for healthier alternatives.  Have your labs drawn and we will contact you when we receive the results.  Try to get exercise a minimum of 30 minutes per day at least 5 days per week as well as  adequate water intake all while measuring blood pressure a few times per week.  Keep a blood pressure log and bring back to clinic at your next visit.  If your readings are consistently over 130/80 to contact our office/send me a MyChart message and we will see you sooner.  Can try DASH and Mediterranean diet options, avoiding processed foods, lowering sodium intake, avoiding pork products, and eating a plant based diet for optimal health.  We will plan to see you back in 6 months for hypertension follow up visit  You will receive a survey after today's visit either digitally by e-mail or paper by USPS mail. Your experiences and feedback matter to [place].  Please respond so we know how we are doing as we provide care for you.  Call us with any questions/concerns/needs.  It is my goal to be available to you for your health concerns.  Thanks for choosing me to be a partner in your healthcare needs!  Charlaine Dalton, FNP-C Family Nurse Practitioner [facility] Phone: [phone]

## 2019-10-22 NOTE — Assessment & Plan Note (Signed)
Currently well controlled on omeprazole 40mg once daily.  Plan: 1. Continue omeprazole 40mg once daily. Side effects discussed. Pt wants to continue med. 2. Avoid diet triggers. Reviewed need to seek care if globus sensation, difficulty swallowing, s/sx of GI bleed. 3. Follow up as needed and in 6 months.  

## 2019-10-22 NOTE — Assessment & Plan Note (Signed)
Controlled hypertension.  BP is at goal < 130/80.  Pt not working on lifestyle modifications.  Taking medications tolerating well without side effects.  Complications: Obesity, GERD  Plan: 1. Continue taking amlodipine 10mg  daily 2. Obtain labs within the next 2 weeks  3. Encouraged heart healthy diet and increasing exercise to 30 minutes most days of the week, going no more than 2 days in a row without exercise. 4. Check BP 1-2 x per week at home, keep log, and bring to clinic at next appointment. 5. Follow up 6 months.

## 2019-10-22 NOTE — Progress Notes (Signed)
Subjective:    Patient ID: Kenly Xiao, male    DOB: 05-02-1970, 49 y.o.   MRN: [id number]  Harve Spradley is a 49 y.o. male presenting on 10/22/2019 for Gastroesophageal Reflux and Hypertension   HPI  Hypertension - He is not checking BP at home or outside of clinic.    - Current medications: amlodipine 37m daily, tolerating well without side effects - He is not currently symptomatic. - Pt denies headache, lightheadedness, dizziness, changes in vision, chest tightness/pressure, palpitations, leg swelling, sudden loss of speech or loss of consciousness. - He  reports no regular exercise routine. - His diet is high in salt, high in fat, and high in carbohydrates.  Follow up for GERD.  Reports symptoms well managed with Omeprazole 443mtaken once daily.   Denies any acute concerns.  [facility] 04/29/[facility] 11/25/2018  Decreased Interest 0 0  Down, Depressed, Hopeless 0 1  PHQ - 2 Score 0 1  Altered sleeping - 0  Tired, decreased energy - 0  Change in appetite - 0  Feeling bad or failure about yourself  - 0  Trouble concentrating - 0  Moving slowly or fidgety/restless - 0  Suicidal thoughts - 0  PHQ-9 Score - 1  Difficult doing work/chores - Not difficult at all    Social History   Tobacco Use  . Smoking status: Never Smoker  . Smokeless tobacco: Never Used  Substance Use Topics  . Alcohol use: Never  . Drug use: Never    Review of Systems  Constitutional: Negative.   HENT: Negative.   Eyes: Negative.   Respiratory: Negative.   Cardiovascular: Negative.   Gastrointestinal: Negative.   Endocrine: Negative.   Genitourinary: Negative.   Musculoskeletal: Negative.   Skin: Negative.   Allergic/Immunologic: Negative.   Neurological: Negative.   Hematological: Negative.   Psychiatric/Behavioral: Negative.    Per HPI unless specifically indicated above     Objective:    BP 128/89 (BP Location: Left Arm, Patient Position: Sitting, Cuff Size:  Large)   Pulse 84   Temp (!) 97.1 F (36.2 C) (Temporal)   Ht _0  (1.803 m)   Wt 278 lb 3.2 oz (126.2 kg)   BMI 38.80 kg/m   Wt Readings from Last 3 Encounters:  10/22/19 278 lb 3.2 oz (126.2 kg)  04/29/19 266 lb (120.7 kg)  11/25/18 278 lb 12.8 oz (126.5 kg)    Physical Exam Vitals reviewed.  Constitutional:      General: He is not in acute distress.    Appearance: Normal appearance. He is well-developed and well-groomed. He is obese. He is not ill-appearing or toxic-appearing.  HENT:     Head: Normocephalic and atraumatic.     Nose:     Comments: FaLizbeth Barks in place, covering mouth and nose. Eyes:     General:        Right eye: No discharge.        Left eye: No discharge.     Extraocular Movements: Extraocular movements intact.     Conjunctiva/sclera: Conjunctivae normal.     Pupils: Pupils are equal, round, and reactive to light.  Cardiovascular:     Rate and Rhythm: Normal rate and regular rhythm.     Pulses: Normal pulses.     Heart sounds: Normal heart sounds. No murmur heard.  No friction rub. No gallop.   Pulmonary:     Effort: Pulmonary effort is normal. No respiratory distress.     Breath sounds: Normal  breath sounds.  Musculoskeletal:     Right lower leg: No edema.     Left lower leg: No edema.  Skin:    General: Skin is warm and dry.     Capillary Refill: Capillary refill takes less than 2 seconds.  Neurological:     General: No focal deficit present.     Mental Status: He is alert and oriented to person, place, and time.  Psychiatric:        Attention and Perception: Attention and perception normal.        Mood and Affect: Mood and affect normal.        Speech: Speech is tangential.        Behavior: Behavior normal. Behavior is cooperative.        Thought Content: Thought content normal.    Results for orders placed or performed in visit on 10/22/19  POCT glycosylated hemoglobin (Hb A1C)  Result Value Ref Range   Hemoglobin A1C 5.3 4.0 - 5.6 %    HbA1c POC (<> result, manual entry)     HbA1c, POC (prediabetic range)     HbA1c, POC (controlled diabetic range)    POCT Urinalysis Dipstick  Result Value Ref Range   Color, UA Yellow    Clarity, UA clear    Glucose, UA Negative Negative   Bilirubin, UA negative    Ketones, UA negative    Spec Grav, UA <=1.005 (A) 1.010 - 1.025   Blood, UA negative    pH, UA 5.0 5.0 - 8.0   Protein, UA Negative Negative   Urobilinogen, UA 0.2 0.2 or 1.0 E.U./dL   Nitrite, UA negative    Leukocytes, UA Negative Negative   Appearance     Odor        Assessment & Plan:   Problem List Items Addressed This Visit      Cardiovascular and Mediastinum   Essential hypertension - Primary    Controlled hypertension.  BP is at goal < 130/80.  Pt not working on lifestyle modifications.  Taking medications tolerating well without side effects.  Complications: Obesity, GERD  Plan: 1. Continue taking amlodipine 25m daily 2. Obtain labs within the next 2 weeks  3. Encouraged heart healthy diet and increasing exercise to 30 minutes most days of the week, going no more than 2 days in a row without exercise. 4. Check BP 1-2 x per week at home, keep log, and bring to clinic at next appointment. 5. Follow up 6 months.         Relevant Orders   POCT Urinalysis Dipstick (Completed)   CBC with Differential   CMP14+EGFR     Digestive   Gastroesophageal reflux disease    Currently well controlled on omeprazole 419monce daily.  Plan: 1. Continue omeprazole 4071mnce daily. Side effects discussed. Pt wants to continue med. 2. Avoid diet triggers. Reviewed need to seek care if globus sensation, difficulty swallowing, s/sx of GI bleed. 3. Follow up as needed and in 6 months.         Other   Weight gain finding   Relevant Orders   Lipid Profile   Thyroid Panel With TSH    Other Visit Diagnoses    Elevated glucose       Relevant Orders   POCT glycosylated hemoglobin (Hb A1C) (Completed)    CMP14+EGFR   Long-term use of high-risk medication       Relevant Orders   Lipid Profile   Thyroid Panel With TSH  No orders of the defined types were placed in this encounter.     Follow up plan: Return in about 6 months (around 04/23/2020) for HTN, GERD F/U Visit.   Harlin Rain, [place] Family Nurse Practitioner [facility] 10/22/2019, 2:37 PM

## 2019-10-28 LAB — CMP14+EGFR
ALT: 19 IU/L (ref 0–44)
AST: 20 IU/L (ref 0–40)
Albumin/Globulin Ratio: 2 (ref 1.2–2.2)
Albumin: 4.6 g/dL (ref 4.0–5.0)
Alkaline Phosphatase: 118 IU/L (ref 48–121)
BUN/Creatinine Ratio: 6 — ABNORMAL LOW (ref 9–20)
BUN: 8 mg/dL (ref 6–24)
Bilirubin Total: 0.5 mg/dL (ref 0.0–1.2)
CO2: 20 mmol/L (ref 20–29)
Calcium: 9.7 mg/dL (ref 8.7–10.2)
Chloride: 105 mmol/L (ref 96–106)
Creatinine, Ser: 1.44 mg/dL — ABNORMAL HIGH (ref 0.76–1.27)
GFR calc Af Amer: 67 mL/min/{1.73_m2} (ref >59)
GFR calc non Af Amer: 58 mL/min/{1.73_m2} — ABNORMAL LOW (ref >59)
Globulin, Total: 2.3 g/dL (ref 1.5–4.5)
Glucose: 93 mg/dL (ref 65–99)
Potassium: 4.3 mmol/L (ref 3.5–5.2)
Sodium: 140 mmol/L (ref 134–144)
Total Protein: 6.9 g/dL (ref 6.0–8.5)

## 2019-10-28 LAB — CBC WITH DIFFERENTIAL/PLATELET
Basophils Absolute: 0 10*3/uL (ref 0.0–0.2)
Basos: 1 %
EOS (ABSOLUTE): 0.3 10*3/uL (ref 0.0–0.4)
Eos: 5 %
Hematocrit: 42.3 % (ref 37.5–51.0)
Hemoglobin: 13.4 g/dL (ref 13.0–17.7)
Immature Grans (Abs): 0 10*3/uL (ref 0.0–0.1)
Immature Granulocytes: 0 %
Lymphocytes Absolute: 1.5 10*3/uL (ref 0.7–3.1)
Lymphs: 31 %
MCH: 25.3 pg — ABNORMAL LOW (ref 26.6–33.0)
MCHC: 31.7 g/dL (ref 31.5–35.7)
MCV: 80 fL (ref 79–97)
Monocytes Absolute: 0.4 10*3/uL (ref 0.1–0.9)
Monocytes: 9 %
Neutrophils Absolute: 2.6 10*3/uL (ref 1.4–7.0)
Neutrophils: 54 %
Platelets: 276 10*3/uL (ref 150–450)
RBC: 5.29 x10E6/uL (ref 4.14–5.80)
RDW: 13.9 % (ref 11.6–15.4)
WBC: 4.8 10*3/uL (ref 3.4–10.8)

## 2019-10-28 LAB — THYROID PANEL WITH TSH
Free Thyroxine Index: 1.3 (ref 1.2–4.9)
T3 Uptake Ratio: 25 % (ref 24–39)
T4, Total: 5.2 ug/dL (ref 4.5–12.0)
TSH: 3.8 u[IU]/mL (ref 0.450–4.500)

## 2019-10-28 LAB — LIPID PANEL
Chol/HDL Ratio: 4.3 ratio (ref 0.0–5.0)
Cholesterol, Total: 185 mg/dL (ref 100–199)
HDL: 43 mg/dL (ref >39)
LDL Chol Calc (NIH): 123 mg/dL — ABNORMAL HIGH (ref 0–99)
Triglycerides: 102 mg/dL (ref 0–149)
VLDL Cholesterol Cal: 19 mg/dL (ref 5–40)

## 2019-11-13 ENCOUNTER — Other Ambulatory Visit: Payer: Self-pay | Admitting: Family Medicine

## 2019-11-13 DIAGNOSIS — I10 Essential (primary) hypertension: Secondary | ICD-10-CM

## 2019-11-25 ENCOUNTER — Other Ambulatory Visit: Payer: Self-pay

## 2019-12-07 ENCOUNTER — Encounter: Payer: Self-pay | Admitting: Podiatry

## 2019-12-07 ENCOUNTER — Ambulatory Visit (INDEPENDENT_AMBULATORY_CARE_PROVIDER_SITE_OTHER): Payer: Medicare Other | Admitting: Podiatry

## 2019-12-07 ENCOUNTER — Other Ambulatory Visit: Payer: Self-pay

## 2019-12-07 DIAGNOSIS — M79674 Pain in right toe(s): Secondary | ICD-10-CM | POA: Diagnosis not present

## 2019-12-07 DIAGNOSIS — B351 Tinea unguium: Secondary | ICD-10-CM | POA: Diagnosis not present

## 2019-12-07 NOTE — Addendum Note (Signed)
Addended by: Helane Gunther on: 12/07/2019 11:24 AM   Modules accepted: Level of Service

## 2019-12-07 NOTE — Progress Notes (Signed)
This patient presents  to the office for evaluation and treatment of long thick painful toenail right foot. .  This patient is unable to trim his own nails since the patient cannot reach his feet.  Patient says the nails are painful walking and wearing his shoes.  He returns for preventive foot care services.  General Appearance  Alert, conversant and in no acute stress.  Vascular  Dorsalis pedis and posterior tibial  pulses are palpable  bilaterally.  Capillary return is within normal limits  bilaterally. Temperature is within normal limits  bilaterally.  Neurologic  Senn-Weinstein monofilament wire test within normal limits  bilaterally. Muscle power within normal limits bilaterally.  Nails Thick disfigured discolored nails with subungual debris  Right hallux nail.. No evidence of bacterial infection or drainage bilaterally.  Orthopedic  No limitations of motion  feet .  No crepitus or effusions noted.  No bony pathology or digital deformities noted.  Skin  normotropic skin with no porokeratosis noted bilaterally.  No signs of infections or ulcers noted.     Onychomycosis  Pain in toes right foot    Debridement  of nails  Hallux nail  with a nail nipper.  Nails were then filed using a dremel tool with no incidents.    RTC prn  Helane Gunther DPM

## 2020-01-13 ENCOUNTER — Telehealth: Payer: Self-pay | Admitting: Family Medicine

## 2020-01-13 NOTE — Telephone Encounter (Signed)
I left a message asking the patient to call and schedule virtual AWV with Nickeah.  If the patient calls back, please schedule virtual Medicare Wellness Visit with Nurse Health Advisor at next available opening. Last AWV 11/25/2018 VDM (DD)

## 2020-04-21 ENCOUNTER — Ambulatory Visit: Payer: Medicare Other | Admitting: Family Medicine

## 2020-10-29 ENCOUNTER — Emergency Department: Payer: Medicare Other

## 2020-10-29 ENCOUNTER — Emergency Department
Admission: EM | Admit: 2020-10-29 | Discharge: 2020-10-29 | Disposition: A | Discharge status: Home / Self Care | Payer: Medicare Other | Attending: Emergency Medicine | Admitting: Emergency Medicine

## 2020-10-29 ENCOUNTER — Other Ambulatory Visit: Payer: Self-pay

## 2020-10-29 DIAGNOSIS — I10 Essential (primary) hypertension: Secondary | ICD-10-CM | POA: Insufficient documentation

## 2020-10-29 DIAGNOSIS — R55 Syncope and collapse: Secondary | ICD-10-CM | POA: Insufficient documentation

## 2020-10-29 DIAGNOSIS — Z79899 Other long term (current) drug therapy: Secondary | ICD-10-CM | POA: Insufficient documentation

## 2020-10-29 LAB — CBC
HCT: 41.4 % (ref 39.0–52.0)
Hemoglobin: 13.8 g/dL (ref 13.0–17.0)
MCH: 26.5 pg (ref 26.0–34.0)
MCHC: 33.3 g/dL (ref 30.0–36.0)
MCV: 79.6 fL — ABNORMAL LOW (ref 80.0–100.0)
Platelets: 265 10*3/uL (ref 150–400)
RBC: 5.2 MIL/uL (ref 4.22–5.81)
RDW: 15.1 % (ref 11.5–15.5)
WBC: 4.8 10*3/uL (ref 4.0–10.5)
nRBC: 0 % (ref 0.0–0.2)

## 2020-10-29 LAB — BASIC METABOLIC PANEL
Anion gap: 6 (ref 5–15)
BUN: 10 mg/dL (ref 6–20)
CO2: 24 mmol/L (ref 22–32)
Calcium: 9.3 mg/dL (ref 8.9–10.3)
Chloride: 107 mmol/L (ref 98–111)
Creatinine, Ser: 1.12 mg/dL (ref 0.61–1.24)
GFR, Estimated: >60 mL/min (ref >60)
Glucose, Bld: 100 mg/dL — ABNORMAL HIGH (ref 70–99)
Potassium: 3.7 mmol/L (ref 3.5–5.1)
Sodium: 137 mmol/L (ref 135–145)

## 2020-10-29 LAB — URINALYSIS, COMPLETE (UACMP) WITH MICROSCOPIC
Bacteria, UA: NONE SEEN
Bilirubin Urine: NEGATIVE
Glucose, UA: NEGATIVE mg/dL
Hgb urine dipstick: NEGATIVE
Ketones, ur: NEGATIVE mg/dL
Leukocytes,Ua: NEGATIVE
Nitrite: NEGATIVE
Protein, ur: NEGATIVE mg/dL
Specific Gravity, Urine: 1.003 — ABNORMAL LOW (ref 1.005–1.030)
Squamous Epithelial / LPF: NONE SEEN (ref 0–5)
pH: 7 (ref 5.0–8.0)

## 2020-10-29 NOTE — ED Triage Notes (Signed)
Pt in via EMS from [facility] with a syncopal episode.

## 2020-10-29 NOTE — ED Triage Notes (Signed)
Pt states he came to ED because he was todl he had a syncopal episode. Unsure of hitting head. No trauma noted Denies hx of seizure. No loss of bowel or bladder.  20g to left hand EMS  No legal guardian noted

## 2020-10-29 NOTE — ED Triage Notes (Signed)
Pt not yet returned from cafeteria

## 2020-10-29 NOTE — ED Notes (Signed)
Pt transported to CT via stretcher at this time.  

## 2020-10-29 NOTE — ED Provider Notes (Signed)
[facility] Emergency Department Provider Note  ____________________________________________  Time seen: Approximately 12:13 PM  I have reviewed the triage vital signs and the nursing notes.   HISTORY  Chief Complaint Loss of Consciousness    HPI Jonathan Cabrera is a 50 y.o. male who presents emergency department complaining of syncopal episode today.  According to the patient he lives in a group home, was talking to the director today when he reportedly passed out.  Patient states that he was not feeling bad before the incident, is not feeling bad after the incident.  Patient states that he has a remote history of seizures with his last seizure being over 25 years ago.  Because of the episode the staff were concerned that he had a seizure but he states that he had no loss of bowel or bladder function.  He said that they did not report any tonic or clonic type activity.  Patient again currently denies any complaints to include headache, fevers or chills, nasal congestion, sore throat, coughing, chest pain, abdominal pain.  Patient states that he feels "good" and would like a snack and like to go back home.       Past Medical History:  Diagnosis Date   Anxiety    Bipolar 1 disorder (HCC)    Depression    GERD (gastroesophageal reflux disease)    Schizoaffective disorder (HCC)    TBI (traumatic brain injury) (HCC)     Patient Active Problem List   Diagnosis Date Noted   Onychomycosis 12/07/2019   Bipolar 1 disorder (HCC)    Essential hypertension 12/02/2017   Weight gain finding 12/02/2017   Schizoaffective disorder, chronic condition (HCC) 09/09/2017   Gastroesophageal reflux disease 09/09/2017    No past surgical history on file.  Prior to Admission medications   Medication Sig Start Date End Date Taking? Authorizing Provider  amLODipine (NORVASC) 10 MG tablet TAKE 1 TABLET BY MOUTH ONCE DAILY 11/13/19   Malfi, Jodelle Gross, FNP  benztropine (COGENTIN) 1  MG tablet Take 1 mg by mouth 2 (two) times daily.    [provider]  fluPHENAZine (PROLIXIN) 5 MG tablet Take 10 mg by mouth daily. In am    [provider]  fluPHENAZine (PROLIXIN) 5 MG tablet Take 15 mg by mouth at bedtime.     [provider]  hydrOXYzine (ATARAX/VISTARIL) 25 MG tablet Take 25 mg by mouth daily as needed.    [provider]  lithium carbonate (LITHOBID) 300 MG CR tablet Take 600 mg by mouth 2 (two) times daily.    [provider]  Melatonin 3 MG TABS Take 2 tablets by mouth at bedtime.     [provider]  omeprazole (PRILOSEC) 40 MG capsule TAKE 1 CAPSULE BY MOUTH ONCE DAILY 09/24/19   Althea Charon, Netta Neat, DO  polyethylene glycol powder (GLYCOLAX/MIRALAX) 17 GM/SCOOP powder Take 8.5 g by mouth every Monday, Wednesday, and Friday. 11/21/18   Galen Manila, NP  QUEtiapine (SEROQUEL) 100 MG tablet Take 100 mg by mouth 2 (two) times daily.    [provider]  QUEtiapine (SEROQUEL) 300 MG tablet Take 300 mg by mouth at bedtime.    [provider]    Allergies Erythromycin and Penicillins  No family history on file.  Social History Social History   Tobacco Use   Smoking status: Never   Smokeless tobacco: Never  Substance Use Topics   Alcohol use: Never   Drug use: Never     Review  of Systems  Constitutional: No fever/chills Eyes: No visual changes. No discharge ENT: No upper respiratory complaints. Cardiovascular: no chest pain. Respiratory: no cough. No SOB. Gastrointestinal: No abdominal pain.  No nausea, no vomiting.  No diarrhea.  No constipation. Genitourinary: Negative for dysuria. No hematuria Musculoskeletal: Negative for musculoskeletal pain. Skin: Negative for rash, abrasions, lacerations, ecchymosis. Neurological: Positive for syncope. Negative for headaches, focal weakness or numbness.  10 System ROS otherwise  negative.  ____________________________________________   PHYSICAL EXAM:  VITAL SIGNS: ED Triage Vitals [10/29/20 1145]  Enc Vitals Group     BP 126/87     Pulse Rate 76     Resp 20     Temp 98.7 F (37.1 C)     Temp Source Oral     SpO2 96 %     Weight 250 lb (113.4 kg)     Height 6' (1.829 m)     Head Circumference      Peak Flow      Pain Score 0     Pain Loc      Pain Edu?      Excl. in GC?      Constitutional: Alert and oriented. Well appearing and in no acute distress. Eyes: Conjunctivae are normal. PERRL. EOMI. Head: Atraumatic.  No visible signs of trauma with abrasions, lacerations, ecchymosis. ENT:      Ears:       Nose: No congestion/rhinnorhea.      Mouth/Throat: Mucous membranes are moist.  Neck: No stridor.  No cervical spine tenderness to palpation.  Full range of motion to the cervical spine at this time.  Cardiovascular: Normal rate, regular rhythm. Normal S1 and S2.  Good peripheral circulation. Respiratory: Normal respiratory effort without tachypnea or retractions. Lungs CTAB. Good air entry to the bases with no decreased or absent breath sounds. Gastrointestinal: Bowel sounds 4 quadrants. Soft and nontender to palpation. No guarding or rigidity. No palpable masses. No distention Musculoskeletal: Full range of motion to all extremities. No gross deformities appreciated. Neurologic:  Normal speech and language. No gross focal neurologic deficits are appreciated.  Cranial nerves II through XII grossly intact.  Negative Romberg's and pronator drift.  Equal grip strength bilateral upper extremities. Skin:  Skin is warm, dry and intact. No rash noted. Psychiatric: Mood and affect are normal. Speech and behavior are normal. Patient exhibits appropriate insight and judgement.   ____________________________________________   LABS (all labs ordered are listed, but only abnormal results are displayed)  Labs Reviewed  BASIC METABOLIC PANEL - Abnormal;  Notable for the following components:      Result Value   Glucose, Bld 100 (*)    All other components within normal limits  CBC - Abnormal; Notable for the following components:   MCV 79.6 (*)    All other components within normal limits  URINALYSIS, COMPLETE (UACMP) WITH MICROSCOPIC - Abnormal; Notable for the following components:   Color, Urine STRAW (*)    APPearance CLEAR (*)    Specific Gravity, Urine 1.003 (*)    All other components within normal limits  CBG MONITORING, ED   ____________________________________________  EKG   ____________________________________________  RADIOLOGY I personally viewed and evaluated these images as part of my medical decision making, as well as reviewing the written report by the radiologist.  ED Provider Interpretation: Reassuring imaging with no acute findings  DG Chest 2 View  Result Date: 10/29/2020 CLINICAL DATA:  Syncope. EXAM: CHEST - 2 VIEW COMPARISON:  None. FINDINGS: The heart  size and mediastinal contours are within normal limits. Both lungs are clear. The visualized skeletal structures are unremarkable. IMPRESSION: No active cardiopulmonary disease. Electronically Signed   By: Gerome Sam III M.D   On: 10/29/2020 12:42   CT Head Wo Contrast  Result Date: 10/29/2020 CLINICAL DATA:  Syncope. EXAM: CT HEAD WITHOUT CONTRAST TECHNIQUE: Contiguous axial images were obtained from the base of the skull through the vertex without intravenous contrast. COMPARISON:  None. FINDINGS: Brain: No evidence of acute infarction, hemorrhage, hydrocephalus, extra-axial collection or mass lesion/mass effect. Mild cerebral atrophy, slightly advanced for age. Vascular: No hyperdense vessel or unexpected calcification. Skull: Normal. Negative for fracture or focal lesion. Sinuses/Orbits: No acute finding. Other: None. IMPRESSION: 1.  No acute intracranial abnormality. Electronically Signed   By: Obie Dredge M.D.   On: 10/29/2020 12:49     ____________________________________________    PROCEDURES  Procedure(s) performed:    Procedures    Medications - No data to display   ____________________________________________   INITIAL IMPRESSION / ASSESSMENT AND PLAN / ED COURSE  Pertinent labs & imaging results that were available during my care of the patient were reviewed by me and considered in my medical decision making (see chart for details).  Review of the [place] CSRS was performed in accordance of the [facility] prior to dispensing any controlled drugs.           Patient's diagnosis is consistent with syncope.  Patient presented to the emergency department after syncopal episode today.  He denied any symptoms prior to or status post this event.  Overall exam is reassuring.  EKG is reassuring.  Patient has no symptoms and is highly desirous of leaving.  Patient was then to leave AMA but did allow [place] to finish the work-up.  No acute findings to explain syncope.  Patient remains asymptomatic at this time, states that he would like to leave.  This time no indication for further work-up.  No medications at this time.  Follow-up primary care..  Patient is given ED precautions to return to the ED for any worsening or new symptoms.     ____________________________________________  FINAL CLINICAL IMPRESSION(S) / ED DIAGNOSES  Final diagnoses:  Syncope, unspecified syncope type      NEW MEDICATIONS STARTED DURING THIS VISIT:  ED Discharge Orders     None           This chart was dictated using voice recognition software/Dragon. Despite best efforts to proofread, errors can occur which can change the meaning. Any change was purely unintentional.    Racheal Patches, PA-C 10/29/20 1517    Delton Prairie, MD 10/29/20 [phone]

## 2020-10-29 NOTE — ED Notes (Signed)
Pt provided with Ginger Ale and graham crackers per request.

## 2020-10-29 NOTE — ED Notes (Signed)
PA at bedside assessing pt at this time.

## 2020-10-29 NOTE — ED Triage Notes (Signed)
Pt called for triage. Pt states he is going to cafeteria first

## 2021-02-02 DIAGNOSIS — E1122 Type 2 diabetes mellitus with diabetic chronic kidney disease: Secondary | ICD-10-CM | POA: Diagnosis present

## 2021-02-02 DIAGNOSIS — N1831 Chronic kidney disease, stage 3a: Secondary | ICD-10-CM | POA: Diagnosis present

## 2021-02-02 DIAGNOSIS — D631 Anemia in chronic kidney disease: Secondary | ICD-10-CM | POA: Insufficient documentation

## 2021-02-02 DIAGNOSIS — E663 Overweight: Secondary | ICD-10-CM | POA: Insufficient documentation

## 2021-02-02 DIAGNOSIS — E8881 Metabolic syndrome: Secondary | ICD-10-CM | POA: Insufficient documentation

## 2021-02-06 ENCOUNTER — Other Ambulatory Visit: Payer: Self-pay | Admitting: Nephrology

## 2021-02-06 ENCOUNTER — Other Ambulatory Visit (HOSPITAL_COMMUNITY): Payer: Self-pay | Admitting: Nephrology

## 2021-02-06 DIAGNOSIS — R8290 Unspecified abnormal findings in urine: Secondary | ICD-10-CM

## 2021-02-06 DIAGNOSIS — N1831 Chronic kidney disease, stage 3a: Secondary | ICD-10-CM

## 2021-02-16 ENCOUNTER — Ambulatory Visit (INDEPENDENT_AMBULATORY_CARE_PROVIDER_SITE_OTHER): Payer: Medicare Other | Admitting: Podiatry

## 2021-02-16 ENCOUNTER — Other Ambulatory Visit: Payer: Self-pay

## 2021-02-16 ENCOUNTER — Encounter (INDEPENDENT_AMBULATORY_CARE_PROVIDER_SITE_OTHER): Payer: Self-pay

## 2021-02-16 ENCOUNTER — Encounter: Payer: Self-pay | Admitting: Podiatry

## 2021-02-16 DIAGNOSIS — B351 Tinea unguium: Secondary | ICD-10-CM | POA: Diagnosis not present

## 2021-02-16 NOTE — Progress Notes (Addendum)
This patient presents  to the office for evaluation and treatment of long thick painful toenail right foot. .  This patient is unable to trim his own nails since the patient cannot reach his feet.  Patient says the nails are painful walking and wearing his shoes.  He returns for preventive foot care services.  General Appearance  Alert, conversant and in no acute stress.  Vascular  Dorsalis pedis and posterior tibial  pulses are palpable  bilaterally.  Capillary return is within normal limits  bilaterally. Temperature is within normal limits  bilaterally.  Neurologic  Senn-Weinstein monofilament wire test within normal limits  bilaterally. Muscle power within normal limits bilaterally.  Nails Thick disfigured discolored nails with subungual debris  Right hallux nail.. No evidence of bacterial infection or drainage bilaterally.  Orthopedic  No limitations of motion  feet .  No crepitus or effusions noted.  No bony pathology or digital deformities noted.  Skin  normotropic skin with no porokeratosis noted bilaterally.  No signs of infections or ulcers noted.     Onychomycosis  Pain in toes right foot    Debridement  of nails  Hallux nail  with a nail nipper.  Nails were then filed using a dremel tool with no incidents.    RTC 1 year  Helane Gunther DPM

## 2021-02-23 ENCOUNTER — Ambulatory Visit
Admission: RE | Admit: 2021-02-23 | Discharge: 2021-02-23 | Disposition: A | Discharge status: Home / Self Care | Payer: Medicare Other | Source: Ambulatory Visit | Attending: Nephrology | Admitting: Nephrology

## 2021-02-23 ENCOUNTER — Other Ambulatory Visit: Payer: Self-pay

## 2021-02-23 DIAGNOSIS — R8290 Unspecified abnormal findings in urine: Secondary | ICD-10-CM | POA: Diagnosis present

## 2021-02-23 DIAGNOSIS — N1831 Chronic kidney disease, stage 3a: Secondary | ICD-10-CM

## 2021-03-02 DIAGNOSIS — N281 Cyst of kidney, acquired: Secondary | ICD-10-CM | POA: Insufficient documentation

## 2022-05-17 ENCOUNTER — Other Ambulatory Visit
Admission: RE | Admit: 2022-05-17 | Discharge: 2022-05-17 | Disposition: A | Discharge status: Home / Self Care | Payer: Medicare Other | Attending: Nephrology | Admitting: Nephrology

## 2022-05-17 DIAGNOSIS — D631 Anemia in chronic kidney disease: Secondary | ICD-10-CM | POA: Insufficient documentation

## 2022-05-17 DIAGNOSIS — E1122 Type 2 diabetes mellitus with diabetic chronic kidney disease: Secondary | ICD-10-CM | POA: Insufficient documentation

## 2022-05-17 DIAGNOSIS — E663 Overweight: Secondary | ICD-10-CM | POA: Diagnosis not present

## 2022-05-17 DIAGNOSIS — I129 Hypertensive chronic kidney disease with stage 1 through stage 4 chronic kidney disease, or unspecified chronic kidney disease: Secondary | ICD-10-CM | POA: Diagnosis not present

## 2022-05-17 DIAGNOSIS — R8290 Unspecified abnormal findings in urine: Secondary | ICD-10-CM | POA: Diagnosis present

## 2022-05-17 DIAGNOSIS — N1831 Chronic kidney disease, stage 3a: Secondary | ICD-10-CM | POA: Insufficient documentation

## 2022-05-17 DIAGNOSIS — N281 Cyst of kidney, acquired: Secondary | ICD-10-CM | POA: Diagnosis not present

## 2022-05-17 DIAGNOSIS — E8881 Metabolic syndrome: Secondary | ICD-10-CM | POA: Diagnosis not present

## 2022-05-17 DIAGNOSIS — F319 Bipolar disorder, unspecified: Secondary | ICD-10-CM | POA: Diagnosis not present

## 2022-05-17 LAB — RENAL FUNCTION PANEL
Albumin: 4.3 g/dL (ref 3.5–5.0)
Anion gap: 7 (ref 5–15)
BUN: 14 mg/dL (ref 6–20)
CO2: 23 mmol/L (ref 22–32)
Calcium: 9.4 mg/dL (ref 8.9–10.3)
Chloride: 105 mmol/L (ref 98–111)
Creatinine, Ser: 1.14 mg/dL (ref 0.61–1.24)
GFR, Estimated: >60 mL/min (ref >60)
Glucose, Bld: 92 mg/dL (ref 70–99)
Phosphorus: 3.6 mg/dL (ref 2.5–4.6)
Potassium: 3.6 mmol/L (ref 3.5–5.1)
Sodium: 135 mmol/L (ref 135–145)

## 2022-05-17 LAB — PROTEIN / CREATININE RATIO, URINE
Creatinine, Urine: 22 mg/dL
Total Protein, Urine: <6 mg/dL

## 2022-05-17 LAB — CBC WITH DIFFERENTIAL/PLATELET
Abs Immature Granulocytes: 0.01 10*3/uL (ref 0.00–0.07)
Basophils Absolute: 0.1 10*3/uL (ref 0.0–0.1)
Basophils Relative: 1 %
Eosinophils Absolute: 0.4 10*3/uL (ref 0.0–0.5)
Eosinophils Relative: 6 %
HCT: 42.8 % (ref 39.0–52.0)
Hemoglobin: 14.1 g/dL (ref 13.0–17.0)
Immature Granulocytes: 0 %
Lymphocytes Relative: 31 %
Lymphs Abs: 1.7 10*3/uL (ref 0.7–4.0)
MCH: 26 pg (ref 26.0–34.0)
MCHC: 32.9 g/dL (ref 30.0–36.0)
MCV: 79 fL — ABNORMAL LOW (ref 80.0–100.0)
Monocytes Absolute: 0.5 10*3/uL (ref 0.1–1.0)
Monocytes Relative: 10 %
Neutro Abs: 2.8 10*3/uL (ref 1.7–7.7)
Neutrophils Relative %: 52 %
Platelets: 270 10*3/uL (ref 150–400)
RBC: 5.42 MIL/uL (ref 4.22–5.81)
RDW: 14 % (ref 11.5–15.5)
WBC: 5.4 10*3/uL (ref 4.0–10.5)
nRBC: 0 % (ref 0.0–0.2)

## 2022-05-17 LAB — URINALYSIS, COMPLETE (UACMP) WITH MICROSCOPIC
Bilirubin Urine: NEGATIVE
Glucose, UA: >=500 mg/dL — AB
Hgb urine dipstick: NEGATIVE
Ketones, ur: NEGATIVE mg/dL
Leukocytes,Ua: NEGATIVE
Nitrite: NEGATIVE
Protein, ur: NEGATIVE mg/dL
Specific Gravity, Urine: 1 — ABNORMAL LOW (ref 1.005–1.030)
Squamous Epithelial / HPF: NONE SEEN /HPF (ref 0–5)
WBC, UA: NONE SEEN WBC/hpf (ref 0–5)
pH: 7 (ref 5.0–8.0)

## 2022-05-19 LAB — PTH, INTACT AND CALCIUM
Calcium, Total (PTH): 9.7 mg/dL (ref 8.7–10.2)
PTH: 26 pg/mL (ref 15–65)

## 2022-06-24 ENCOUNTER — Other Ambulatory Visit: Payer: Self-pay

## 2022-06-24 ENCOUNTER — Emergency Department
Admission: EM | Admit: 2022-06-24 | Discharge: 2022-06-24 | Disposition: A | Discharge status: Home / Self Care | Payer: Medicare Other | Attending: Emergency Medicine | Admitting: Emergency Medicine

## 2022-06-24 DIAGNOSIS — F250 Schizoaffective disorder, bipolar type: Secondary | ICD-10-CM | POA: Insufficient documentation

## 2022-06-24 DIAGNOSIS — Z87820 Personal history of traumatic brain injury: Secondary | ICD-10-CM | POA: Diagnosis not present

## 2022-06-24 DIAGNOSIS — R9431 Abnormal electrocardiogram [ECG] [EKG]: Secondary | ICD-10-CM | POA: Diagnosis not present

## 2022-06-24 DIAGNOSIS — R55 Syncope and collapse: Secondary | ICD-10-CM | POA: Diagnosis present

## 2022-06-24 LAB — CBC WITH DIFFERENTIAL/PLATELET
Abs Immature Granulocytes: 0 10*3/uL (ref 0.00–0.07)
Basophils Absolute: 0 10*3/uL (ref 0.0–0.1)
Basophils Relative: 1 %
Eosinophils Absolute: 0.2 10*3/uL (ref 0.0–0.5)
Eosinophils Relative: 3 %
HCT: 45 % (ref 39.0–52.0)
Hemoglobin: 14.8 g/dL (ref 13.0–17.0)
Immature Granulocytes: 0 %
Lymphocytes Relative: 22 %
Lymphs Abs: 1.2 10*3/uL (ref 0.7–4.0)
MCH: 26.3 pg (ref 26.0–34.0)
MCHC: 32.9 g/dL (ref 30.0–36.0)
MCV: 79.9 fL — ABNORMAL LOW (ref 80.0–100.0)
Monocytes Absolute: 0.5 10*3/uL (ref 0.1–1.0)
Monocytes Relative: 10 %
Neutro Abs: 3.4 10*3/uL (ref 1.7–7.7)
Neutrophils Relative %: 64 %
Platelets: 264 10*3/uL (ref 150–400)
RBC: 5.63 MIL/uL (ref 4.22–5.81)
RDW: 13.5 % (ref 11.5–15.5)
WBC: 5.2 10*3/uL (ref 4.0–10.5)
nRBC: 0 % (ref 0.0–0.2)

## 2022-06-24 LAB — COMPREHENSIVE METABOLIC PANEL
ALT: 20 U/L (ref 0–44)
AST: 24 U/L (ref 15–41)
Albumin: 4.3 g/dL (ref 3.5–5.0)
Alkaline Phosphatase: 97 U/L (ref 38–126)
Anion gap: 7 (ref 5–15)
BUN: 13 mg/dL (ref 6–20)
CO2: 23 mmol/L (ref 22–32)
Calcium: 9.5 mg/dL (ref 8.9–10.3)
Chloride: 104 mmol/L (ref 98–111)
Creatinine, Ser: 1.19 mg/dL (ref 0.61–1.24)
GFR, Estimated: >60 mL/min (ref >60)
Glucose, Bld: 91 mg/dL (ref 70–99)
Potassium: 3.5 mmol/L (ref 3.5–5.1)
Sodium: 134 mmol/L — ABNORMAL LOW (ref 135–145)
Total Bilirubin: 0.9 mg/dL (ref 0.3–1.2)
Total Protein: 7 g/dL (ref 6.5–8.1)

## 2022-06-24 LAB — ETHANOL: Alcohol, Ethyl (B): <10 mg/dL (ref <10)

## 2022-06-24 LAB — LITHIUM LEVEL: Lithium Lvl: 0.54 mmol/L — ABNORMAL LOW (ref 0.60–1.20)

## 2022-06-24 LAB — SALICYLATE LEVEL: Salicylate Lvl: <7 mg/dL — ABNORMAL LOW (ref 7.0–30.0)

## 2022-06-24 LAB — ACETAMINOPHEN LEVEL: Acetaminophen (Tylenol), Serum: <10 ug/mL — ABNORMAL LOW (ref 10–30)

## 2022-06-24 MED ORDER — SODIUM CHLORIDE 0.9 % IV BOLUS
1000.0000 mL | Freq: Once | INTRAVENOUS | Status: AC
  Administered 2022-06-24: 1000 mL via INTRAVENOUS

## 2022-06-24 NOTE — Discharge Instructions (Addendum)
Your lab tests were all okay today.  Continue taking all of your medications as usual and follow-up with your neurologist.

## 2022-06-24 NOTE — ED Triage Notes (Signed)
Pt to ED via [facility] after seizing in [place] but pt lives in [place] [facility]. Pt has a hx of TBI but has not seized in 17 years that he is aware of. Pt says he "passed out" and likely hit his head. Pt denies any headache/N/V at this time. Pt is A&O x4. VS stable

## 2022-06-24 NOTE — ED Provider Notes (Signed)
[facility] Provider Note    Event Date/Time   First MD Initiated Contact with Patient 06/24/22 1220     (approximate)   History   Chief Complaint: Seizures   HPI  Detavious Bainbridge is a 52 y.o. male with a history of bipolar disorder, schizoaffective, GERD, TBI, GERD who was sent to the ED from his group home due to a syncope episode, suspected to be seizure.  Patient returned and EMS report that he has not had a seizure in 17 years.  Denies any recent illness or preceding symptoms or pain.     Physical Exam   Triage Vital Signs: ED Triage Vitals  Enc Vitals Group     BP 06/24/22 1219 127/76     Pulse Rate 06/24/22 1219 78     Resp 06/24/22 1219 (!) 27     Temp 06/24/22 1219 98.6 F (37 C)     Temp Source 06/24/22 1219 Oral     SpO2 06/24/22 1219 96 %     Weight --      Height --      Head Circumference --      Peak Flow --      Pain Score 06/24/22 1221 0     Pain Loc --      Pain Edu? --      Excl. in [place]? --     Most recent vital signs: Vitals:   06/24/22 1219 06/24/22 1315  BP: 127/76 120/76  Pulse: 78 71  Resp: (!) 27 12  Temp: 98.6 F (37 C)   SpO2: 96% 100%    General: Awake, no distress.  CV:  Good peripheral perfusion.  Regular rate rhythm Resp:  Normal effort.  Clear to auscultation bilaterally Abd:  No distention.  Soft nontender Other:  No lower extremity edema, no inflammatory soft tissue changes, no wounds   ED Results / Procedures / Treatments   Labs (all labs ordered are listed, but only abnormal results are displayed) Labs Reviewed  COMPREHENSIVE METABOLIC PANEL - Abnormal; Notable for the following components:      Result Value   Sodium 134 (*)    All other components within normal limits  CBC WITH DIFFERENTIAL/PLATELET - Abnormal; Notable for the following components:   MCV 79.9 (*)    All other components within normal limits  SALICYLATE LEVEL - Abnormal; Notable for the following components:    Salicylate Lvl [id number] (*)    All other components within normal limits  ACETAMINOPHEN LEVEL - Abnormal; Notable for the following components:   Acetaminophen (Tylenol), Serum <10 (*)    All other components within normal limits  LITHIUM LEVEL - Abnormal; Notable for the following components:   Lithium Lvl 0.54 (*)    All other components within normal limits  ETHANOL     EKG Interpreted by me Sinus rhythm rate of 82.  Normal axis, normal intervals.  Poor R wave progression.  Normal ST segments and T waves   RADIOLOGY    PROCEDURES:  Procedures   MEDICATIONS ORDERED IN ED: Medications  sodium chloride 0.9 % bolus 1,000 mL (1,000 mLs Intravenous New Bag/Given 06/24/22 1401)     IMPRESSION / MDM / ASSESSMENT AND PLAN / ED COURSE  I reviewed the triage vital signs and the nursing notes.  DDx: AKI, dehydration, electrolyte abnormality, lithium toxicity, alcohol, syncope, seizure  Patient's presentation is most consistent with acute presentation with potential threat to life or bodily function.  Patient presents with  syncope versus seizure.  Not on any antiepileptics, suspect this is related to dehydration or orthostatics.  Patient given IV fluids.  He is tolerating oral intake and ate a whole lunch box.       FINAL CLINICAL IMPRESSION(S) / ED DIAGNOSES   Final diagnoses:  Syncope, unspecified syncope type     Rx / DC Orders   ED Discharge Orders     None        Note:  This document was prepared using Dragon voice recognition software and may include unintentional dictation errors.   Carrie Mew, MD 06/24/22 [phone]

## 2022-06-24 NOTE — ED Notes (Signed)
RN called group home for transportation. Jonathan Cabrera agreed to pick up pt soon.

## 2022-08-11 ENCOUNTER — Other Ambulatory Visit: Payer: Self-pay

## 2022-08-11 ENCOUNTER — Encounter: Payer: Self-pay | Admitting: Internal Medicine

## 2022-08-11 ENCOUNTER — Observation Stay
Admission: EM | Admit: 2022-08-11 | Discharge: 2022-08-12 | Disposition: A | Discharge status: Home / Self Care | Payer: Medicare Other | Attending: Internal Medicine | Admitting: Internal Medicine

## 2022-08-11 DIAGNOSIS — K922 Gastrointestinal hemorrhage, unspecified: Principal | ICD-10-CM | POA: Insufficient documentation

## 2022-08-11 DIAGNOSIS — D62 Acute posthemorrhagic anemia: Secondary | ICD-10-CM | POA: Insufficient documentation

## 2022-08-11 DIAGNOSIS — E1122 Type 2 diabetes mellitus with diabetic chronic kidney disease: Secondary | ICD-10-CM | POA: Diagnosis not present

## 2022-08-11 DIAGNOSIS — K2971 Gastritis, unspecified, with bleeding: Secondary | ICD-10-CM | POA: Diagnosis not present

## 2022-08-11 DIAGNOSIS — R1110 Vomiting, unspecified: Secondary | ICD-10-CM | POA: Diagnosis present

## 2022-08-11 DIAGNOSIS — K317 Polyp of stomach and duodenum: Secondary | ICD-10-CM | POA: Diagnosis not present

## 2022-08-11 DIAGNOSIS — I10 Essential (primary) hypertension: Secondary | ICD-10-CM | POA: Diagnosis present

## 2022-08-11 DIAGNOSIS — N1831 Chronic kidney disease, stage 3a: Secondary | ICD-10-CM | POA: Insufficient documentation

## 2022-08-11 DIAGNOSIS — K921 Melena: Secondary | ICD-10-CM | POA: Diagnosis not present

## 2022-08-11 DIAGNOSIS — Z79899 Other long term (current) drug therapy: Secondary | ICD-10-CM | POA: Diagnosis not present

## 2022-08-11 DIAGNOSIS — K920 Hematemesis: Secondary | ICD-10-CM | POA: Diagnosis not present

## 2022-08-11 DIAGNOSIS — I129 Hypertensive chronic kidney disease with stage 1 through stage 4 chronic kidney disease, or unspecified chronic kidney disease: Secondary | ICD-10-CM | POA: Diagnosis not present

## 2022-08-11 DIAGNOSIS — K449 Diaphragmatic hernia without obstruction or gangrene: Secondary | ICD-10-CM | POA: Diagnosis not present

## 2022-08-11 DIAGNOSIS — K2091 Esophagitis, unspecified with bleeding: Secondary | ICD-10-CM | POA: Diagnosis not present

## 2022-08-11 DIAGNOSIS — F319 Bipolar disorder, unspecified: Secondary | ICD-10-CM | POA: Diagnosis present

## 2022-08-11 LAB — COMPREHENSIVE METABOLIC PANEL
ALT: 16 U/L (ref 0–44)
AST: 18 U/L (ref 15–41)
Albumin: 4 g/dL (ref 3.5–5.0)
Alkaline Phosphatase: 80 U/L (ref 38–126)
Anion gap: 7 (ref 5–15)
BUN: 22 mg/dL — ABNORMAL HIGH (ref 6–20)
CO2: 24 mmol/L (ref 22–32)
Calcium: 9.4 mg/dL (ref 8.9–10.3)
Chloride: 107 mmol/L (ref 98–111)
Creatinine, Ser: 1.25 mg/dL — ABNORMAL HIGH (ref 0.61–1.24)
GFR, Estimated: >60 mL/min (ref >60)
Glucose, Bld: 95 mg/dL (ref 70–99)
Potassium: 3.4 mmol/L — ABNORMAL LOW (ref 3.5–5.1)
Sodium: 138 mmol/L (ref 135–145)
Total Bilirubin: 0.9 mg/dL (ref 0.3–1.2)
Total Protein: 7.1 g/dL (ref 6.5–8.1)

## 2022-08-11 LAB — PROTIME-INR
INR: 1.1 (ref 0.8–1.2)
Prothrombin Time: 14.2 seconds (ref 11.4–15.2)

## 2022-08-11 LAB — CBC
HCT: 37.6 % — ABNORMAL LOW (ref 39.0–52.0)
HCT: 37.8 % — ABNORMAL LOW (ref 39.0–52.0)
Hemoglobin: 12.1 g/dL — ABNORMAL LOW (ref 13.0–17.0)
Hemoglobin: 12.3 g/dL — ABNORMAL LOW (ref 13.0–17.0)
MCH: 26.2 pg (ref 26.0–34.0)
MCH: 26.3 pg (ref 26.0–34.0)
MCHC: 32.2 g/dL (ref 30.0–36.0)
MCHC: 32.5 g/dL (ref 30.0–36.0)
MCV: 81.6 fL (ref 80.0–100.0)
MCV: 80.9 fL (ref 80.0–100.0)
Platelets: 269 10*3/uL (ref 150–400)
Platelets: 277 10*3/uL (ref 150–400)
RBC: 4.61 MIL/uL (ref 4.22–5.81)
RBC: 4.67 MIL/uL (ref 4.22–5.81)
RDW: 14.3 % (ref 11.5–15.5)
RDW: 14.1 % (ref 11.5–15.5)
WBC: 6.4 10*3/uL (ref 4.0–10.5)
WBC: 8.2 10*3/uL (ref 4.0–10.5)
nRBC: 0 % (ref 0.0–0.2)
nRBC: 0 % (ref 0.0–0.2)

## 2022-08-11 LAB — GLUCOSE, CAPILLARY
Glucose-Capillary: 70 mg/dL (ref 70–99)
Glucose-Capillary: 88 mg/dL (ref 70–99)

## 2022-08-11 LAB — LIPASE, BLOOD: Lipase: 46 U/L (ref 11–51)

## 2022-08-11 LAB — TYPE AND SCREEN
ABO/RH(D): O POS
Antibody Screen: NEGATIVE

## 2022-08-11 LAB — HEMOGLOBIN A1C
Hgb A1c MFr Bld: 5.3 % (ref 4.8–5.6)
Mean Plasma Glucose: 105.41 mg/dL

## 2022-08-11 LAB — APTT: aPTT: 27 seconds (ref 24–36)

## 2022-08-11 MED ORDER — IRBESARTAN 150 MG PO TABS
75.0000 mg | ORAL_TABLET | Freq: Every day | ORAL | Status: DC
  Filled 2022-08-11: qty 1

## 2022-08-11 MED ORDER — QUETIAPINE FUMARATE 25 MG PO TABS
100.0000 mg | ORAL_TABLET | Freq: Every morning | ORAL | Status: DC
  Administered 2022-08-12: 100 mg via ORAL
  Filled 2022-08-11: qty 4

## 2022-08-11 MED ORDER — QUETIAPINE FUMARATE 300 MG PO TABS
300.0000 mg | ORAL_TABLET | Freq: Every day | ORAL | Status: DC
  Administered 2022-08-11: 300 mg via ORAL
  Filled 2022-08-11: qty 1

## 2022-08-11 MED ORDER — LACTATED RINGERS IV BOLUS
1000.0000 mL | Freq: Once | INTRAVENOUS | Status: AC
  Administered 2022-08-11: 1000 mL via INTRAVENOUS

## 2022-08-11 MED ORDER — ROSUVASTATIN CALCIUM 20 MG PO TABS
20.0000 mg | ORAL_TABLET | Freq: Every day | ORAL | Status: DC
  Administered 2022-08-11: 20 mg via ORAL
  Filled 2022-08-11: qty 1

## 2022-08-11 MED ORDER — FLUPHENAZINE HCL 5 MG PO TABS
10.0000 mg | ORAL_TABLET | Freq: Every morning | ORAL | Status: DC
  Administered 2022-08-12: 10 mg via ORAL
  Filled 2022-08-11: qty 2

## 2022-08-11 MED ORDER — ONDANSETRON HCL 4 MG PO TABS: 4.0000 mg | ORAL_TABLET | Freq: Four times a day (QID) | ORAL | Status: DC | PRN

## 2022-08-11 MED ORDER — ACETAMINOPHEN 650 MG RE SUPP: 650.0000 mg | Freq: Four times a day (QID) | RECTAL | Status: DC | PRN

## 2022-08-11 MED ORDER — LITHIUM CARBONATE ER 300 MG PO TBCR
600.0000 mg | EXTENDED_RELEASE_TABLET | Freq: Every day | ORAL | Status: DC
  Administered 2022-08-11: 600 mg via ORAL
  Filled 2022-08-11 (×2): qty 2

## 2022-08-11 MED ORDER — POTASSIUM CHLORIDE 20 MEQ PO PACK
40.0000 meq | PACK | Freq: Once | ORAL | Status: AC
  Administered 2022-08-11: 40 meq via ORAL
  Filled 2022-08-11: qty 2

## 2022-08-11 MED ORDER — ONDANSETRON HCL 4 MG/2ML IJ SOLN: 4.0000 mg | Freq: Four times a day (QID) | INTRAMUSCULAR | Status: DC | PRN

## 2022-08-11 MED ORDER — POTASSIUM CHLORIDE 10 MEQ/100ML IV SOLN
10.0000 meq | INTRAVENOUS | Status: AC
  Administered 2022-08-11 (×2): 10 meq via INTRAVENOUS
  Filled 2022-08-11 (×2): qty 100

## 2022-08-11 MED ORDER — INSULIN ASPART 100 UNIT/ML IJ SOLN: 0.0000 [IU] | Freq: Three times a day (TID) | INTRAMUSCULAR | Status: DC

## 2022-08-11 MED ORDER — PANTOPRAZOLE SODIUM 40 MG IV SOLR
40.0000 mg | Freq: Once | INTRAVENOUS | Status: AC
  Administered 2022-08-11: 40 mg via INTRAVENOUS
  Filled 2022-08-11: qty 10

## 2022-08-11 MED ORDER — FLUPHENAZINE HCL 5 MG PO TABS
15.0000 mg | ORAL_TABLET | Freq: Every day | ORAL | Status: DC
  Administered 2022-08-11: 15 mg via ORAL
  Filled 2022-08-11: qty 3

## 2022-08-11 MED ORDER — ACETAMINOPHEN 325 MG PO TABS: 650.0000 mg | ORAL_TABLET | Freq: Four times a day (QID) | ORAL | Status: DC | PRN

## 2022-08-11 MED ORDER — AMLODIPINE BESYLATE 10 MG PO TABS
10.0000 mg | ORAL_TABLET | Freq: Every morning | ORAL | Status: DC
  Filled 2022-08-11: qty 1

## 2022-08-11 MED ORDER — QUETIAPINE FUMARATE 25 MG PO TABS
100.0000 mg | ORAL_TABLET | Freq: Every day | ORAL | Status: DC
  Administered 2022-08-11: 100 mg via ORAL
  Filled 2022-08-11: qty 4

## 2022-08-11 MED ORDER — SODIUM CHLORIDE 0.9% FLUSH
3.0000 mL | Freq: Two times a day (BID) | INTRAVENOUS | Status: DC
  Administered 2022-08-11 (×2): 3 mL via INTRAVENOUS

## 2022-08-11 MED ORDER — HYDROXYZINE HCL 50 MG PO TABS
25.0000 mg | ORAL_TABLET | Freq: Every evening | ORAL | Status: DC
  Administered 2022-08-11: 25 mg via ORAL
  Filled 2022-08-11: qty 1

## 2022-08-11 MED ORDER — PANTOPRAZOLE SODIUM 40 MG IV SOLR
40.0000 mg | Freq: Two times a day (BID) | INTRAVENOUS | Status: DC
  Administered 2022-08-11 – 2022-08-12 (×2): 40 mg via INTRAVENOUS
  Filled 2022-08-11: qty 10

## 2022-08-11 MED ORDER — LITHIUM CARBONATE ER 300 MG PO TBCR
300.0000 mg | EXTENDED_RELEASE_TABLET | Freq: Every morning | ORAL | Status: DC
  Filled 2022-08-11: qty 1

## 2022-08-11 MED ORDER — BENZTROPINE MESYLATE 1 MG PO TABS
1.0000 mg | ORAL_TABLET | Freq: Two times a day (BID) | ORAL | Status: DC
  Administered 2022-08-11 – 2022-08-12 (×2): 1 mg via ORAL
  Filled 2022-08-11 (×2): qty 1

## 2022-08-11 NOTE — Assessment & Plan Note (Signed)
Renal function currently at baseline.  Will continue to monitor while admitted  -BMP in the a.m.

## 2022-08-11 NOTE — Assessment & Plan Note (Addendum)
Approximately 2 g decrease in hemoglobin compared to 1 month ago secondary to upper GI bleed.  Patient is not experiencing any symptoms of anemia at this time.  - Telemetry monitoring - Serial CBC - Transfuse for hemoglobin less than 7

## 2022-08-11 NOTE — Care Plan (Signed)
Brief GI Note  Formal consult note to follow in am Pt p/w multiple episodes of vomiting with blood noted with emesis. H/o gastritis. No h/o liver disease, nsaid use or oac/antiplt. Feeling better now per EM provider  Base hgb 13-14. Was 14.8 in march. 12.3 today. VSS BUN 22 Cr 1.25  Esophagogastroduodenoscopy planned for tomorrow pending patient stability and endoscopy suite availability NPO at midnight Clear liquids now Labs in am- bmp, cbc Protonix 40 mg iv q12 h Hold dvt ppx Monitor H&H.  Transfusion and resuscitation as per primary team Avoid frequent lab draws to prevent lab induced anemia Supportive care and antiemetics as per primary team Maintain two sites IV access Avoid nsaids Monitor for GIB. Pt to be admitted to hospitalist service  Jonathan Slipper, DO [facility]

## 2022-08-11 NOTE — Assessment & Plan Note (Addendum)
Patient is presenting with approximately 12-hour episode of both hematemesis that was black and bright red.  Hemoglobin decreased from 14.8-12.3.  No additional episodes since arrival in the ED.  - GI consulted; appreciate their recommendations - Clear liquid diet till midnight.  N.p.o. after midnight - EGD in the a.m. - Protonix 40 mg IV twice daily - IV fluids - Zofran as needed for nausea

## 2022-08-11 NOTE — ED Triage Notes (Signed)
Pt to ED via [facility] from home for c/o vomiting blood. Pt reports vomiting began at 10 pm yesterday evening and states he has vomited about 50 times. Pt was outside residence on EMS arrival and did not have any episodes of emesis, denied nausea and pain. Pt has hx of gastritis and states he has not been taking his medication.  BP 138/82 HR 95 98% RA  21 RR 151 Cbg A&Ox4

## 2022-08-11 NOTE — H&P (Addendum)
History and Physical    Patient: Jonathan Cabrera RUE:[id number] DOB: 04-Nov-1970 DOA: 08/11/2022 DOS: the patient was seen and examined on 08/11/2022 PCP: Franciso Bend, NP  Patient coming from: ALF/ILF  Chief Complaint:  Chief Complaint  Patient presents with   Emesis   HPI: Travoris Cabrera is a 52 y.o. male with medical history significant of CKD stage IIIa, hypertension, hypercalcemia, bipolar/schizoaffective disorder on lithium, anemia of chronic disease, who presents to the ED due to hematemesis.  [name] states that approximately 10:30 PM last night, he had sudden onset nausea and vomiting.  His emesis was initially black with specks in it but subsequently turned bright red.  He states that each emesis event was relatively large volume.  He continued to have events every 30 minutes or so throughout the night but that his symptoms improved this morning.  He denies any NSAID use.  He denies any fever, chills, diarrhea, abdominal pain, chest pain, shortness of breath.  He denies any similar episodes in the past.  ED course: On arrival to the ED, patient was normotensive at 111/71 with heart rate of 79.  He was saturating at 97% air.  He was afebrile at 98.6.  Initial workup notable for hemoglobin of 12.3, platelets 277, potassium 3.4, BUN 22, creatinine 0.25, GFR above 60.  INR within normal limits at 1.1 PTT 27.  Patient started on Protonix.  GI consulted.  TR[facility] contacted for admission.  Review of Systems: As mentioned in the history of present illness. All other systems reviewed and are negative.  Past Medical History:  Diagnosis Date   Anxiety    Bipolar 1 disorder (HCC)    Depression    GERD (gastroesophageal reflux disease)    Schizoaffective disorder (HCC)    TBI (traumatic brain injury) (HCC)    No past surgical history on file. Social History:  reports that he has never smoked. He has never used smokeless tobacco. He reports that he does not drink alcohol and does not  use drugs.  Allergies  Allergen Reactions   Haldol [Haloperidol] Nausea And Vomiting   Erythromycin Rash   Penicillins Rash    No family history on file.  Prior to Admission medications   Medication Sig Start Date End Date Taking? Authorizing Provider  amLODipine (NORVASC) 10 MG tablet TAKE 1 TABLET BY MOUTH ONCE DAILY 11/13/19   Malfi, Jodelle Gross, FNP  benztropine (COGENTIN) 1 MG tablet Take 1 mg by mouth 2 (two) times daily.    [provider]  fluPHENAZine (PROLIXIN) 5 MG tablet Take 10 mg by mouth daily. In am    [provider]  fluPHENAZine (PROLIXIN) 5 MG tablet Take 15 mg by mouth at bedtime.     [provider]  hydrOXYzine (ATARAX/VISTARIL) 25 MG tablet Take 25 mg by mouth daily as needed.    [provider]  lithium carbonate (LITHOBID) 300 MG CR tablet Take 600 mg by mouth 2 (two) times daily.    [provider]  Melatonin 3 MG TABS Take 2 tablets by mouth at bedtime.     [provider]  omeprazole (PRILOSEC) 40 MG capsule TAKE 1 CAPSULE BY MOUTH ONCE DAILY 09/24/19   Althea Charon, Netta Neat, DO  polyethylene glycol powder (GLYCOLAX/MIRALAX) 17 GM/SCOOP powder Take 8.5 g by mouth every Monday, Wednesday, and Friday. 11/21/18   Galen Manila, NP  QUEtiapine (SEROQUEL) 100 MG tablet Take 100 mg by mouth 2 (two) times daily.    [provider]  QUEtiapine (SEROQUEL) 300 MG tablet Take 300 mg by mouth at bedtime.    [provider]    Physical Exam: Vitals:   08/11/22 1101 08/11/22 1103 08/11/22 1130 08/11/22 1502  BP: 121/85  111/71   Pulse: 81  79   Resp: 16     Temp: 98.6 F (37 C)   98.6 F (37 C)  TempSrc: Oral   Oral  SpO2: 97%  97%   Weight:  112.5 kg    Height:  6' (1.829 m)     Physical Exam Vitals and nursing note reviewed.  Constitutional:      General: He is not in acute distress.    Appearance: He is obese. He is not toxic-appearing.  HENT:     Head: Normocephalic and  atraumatic.     Mouth/Throat:     Mouth: Mucous membranes are moist.     Pharynx: Oropharynx is clear.     Comments: Poor dentition Eyes:     Conjunctiva/sclera: Conjunctivae normal.     Pupils: Pupils are equal, round, and reactive to light.  Cardiovascular:     Rate and Rhythm: Normal rate and regular rhythm.     Heart sounds: No murmur heard.    No gallop.  Pulmonary:     Effort: Pulmonary effort is normal. No respiratory distress.     Breath sounds: Normal breath sounds. No wheezing, rhonchi or rales.  Abdominal:     General: Bowel sounds are normal. There is no distension.     Palpations: Abdomen is soft.     Tenderness: There is abdominal tenderness (Epigastric on deep palpation).  Musculoskeletal:     Right lower leg: No edema.     Left lower leg: No edema.  Skin:    General: Skin is warm and dry.  Neurological:     General: No focal deficit present.     Mental Status: He is alert and oriented to person, place, and time. Mental status is at baseline.  Psychiatric:        Mood and Affect: Mood normal.        Behavior: Behavior normal.    Data Reviewed: CBC with WBC of 8.2, hemoglobin 12.3, platelets of 277 CMP with sodium of 138, potassium 3.4, bicarb 24, BUN 22, creatinine 1.25, calcium 9.4, AST 18, ALT 16, GFR above 60 INR within normal limits at 1.1 PTT within normal limits at 27  There are no new results to review at this time.  Assessment and Plan:  * Acute upper GI bleed Patient is presenting with approximately 12-hour episode of both hematemesis that was black and bright red.  Hemoglobin decreased from 14.8-12.3.  No additional episodes since arrival in the ED.  - GI consulted; appreciate their recommendations - Clear liquid diet till midnight.  N.p.o. after midnight - EGD in the a.m. - Protonix 40 mg IV twice daily - IV fluids - Zofran as needed for nausea  Acute blood loss anemia Approximately 2 g decrease in hemoglobin compared to 1 month ago  secondary to upper GI bleed.  Patient is not experiencing any symptoms of anemia at this time.  - Telemetry monitoring - Serial CBC - Transfuse for hemoglobin less than 7  Type 2 diabetes mellitus with diabetic chronic kidney disease (HCC) - A1c pending - Hold home oral antiglycemic agents - SSI, sensitive given on clear liquid diet  Chronic kidney disease, stage 3a (HCC) Renal function currently at baseline.  Will continue to monitor while admitted  -BMP  in the a.m.  Bipolar 1 disorder (HCC) - Restart home Cogentin, Prolixin, lithium and Seroquel  Essential hypertension Blood pressure within goal at this time.  - Resume home antihypertensives  Advance Care Planning:   Code Status: Full Code   Consults: Gastroenterology  Family Communication: No family at bedside  Severity of Illness: The appropriate patient status for this patient is OBSERVATION. Observation status is judged to be reasonable and necessary in order to provide the required intensity of service to ensure the patient's safety. The patient's presenting symptoms, physical exam findings, and initial radiographic and laboratory data in the context of their medical condition is felt to place them at decreased risk for further clinical deterioration. Furthermore, it is anticipated that the patient will be medically stable for discharge from the hospital within 2 midnights of admission.   Author: Verdene Lennert, MD 08/11/2022 3:20 PM  For on call review [email or web address].

## 2022-08-11 NOTE — Assessment & Plan Note (Signed)
Blood pressure within goal at this time.  - Resume home antihypertensives

## 2022-08-11 NOTE — ED Provider Notes (Signed)
[facility] Provider Note    Event Date/Time   First MD Initiated Contact with Patient 08/11/22 1150     (approximate)   History   Emesis   HPI  Laramie Meissner is a 52 y.o. male anemia with chronic kidney disease, schizoaffective, bipolar disorder.  Reports a remote history of gastritis 20 years ago  Patient reports that last night he began having vomiting.  No pain.  He just vomited blood many times.  He reports he was throwing up blood about once every 30 minutes throughout the night.  Called EMS.  He reports the symptoms seem to have gotten better.  He has not thrown up since a little bit before he called EMS.  No weakness.  Takes no blood thinners.  Does not take any aspirin or ibuprofen.  He reports he only takes the medications on his medication list  No chest pain.  No abdominal pain.  He has not noticed any dark stool.  He reports he had "gastritis" 20 years ago.  Used to take Nexium or omeprazole.         Physical Exam   Triage Vital Signs: ED Triage Vitals  Enc Vitals Group     BP 08/11/22 1101 121/85     Pulse Rate 08/11/22 1101 81     Resp 08/11/22 1101 16     Temp 08/11/22 1101 98.6 F (37 C)     Temp Source 08/11/22 1101 Oral     SpO2 08/11/22 1101 97 %     Weight 08/11/22 1103 248 lb (112.5 kg)     Height 08/11/22 1103 6' (1.829 m)     Head Circumference --      Peak Flow --      Pain Score 08/11/22 1102 7     Pain Loc --      Pain Edu? --      Excl. in GC? --     Most recent vital signs: Vitals:   08/11/22 1101 08/11/22 1130  BP: 121/85 111/71  Pulse: 81 79  Resp: 16   Temp: 98.6 F (37 C)   SpO2: 97% 97%     General: Awake, no distress.  Very pleasant.  Slightly flat affect CV:  Good peripheral perfusion.  Normal tones and rate Resp:  Normal effort.  Clear bilateral Tongue and posterior oropharynx.  Normal. Abd:  No distention.  Soft nontender nondistended throughout. Other:  Rectal exam performed, dark black  stool.  Hemoccult very positive.   ED Results / Procedures / Treatments   Labs (all labs ordered are listed, but only abnormal results are displayed) Labs Reviewed  CBC - Abnormal; Notable for the following components:      Result Value   Hemoglobin 12.3 (*)    HCT 37.8 (*)    All other components within normal limits  COMPREHENSIVE METABOLIC PANEL - Abnormal; Notable for the following components:   Potassium 3.4 (*)    BUN 22 (*)    Creatinine, Ser 1.25 (*)    All other components within normal limits  LIPASE, BLOOD  PROTIME-INR  APTT  TYPE AND SCREEN      RADIOLOGY     PROCEDURES:  Critical Care performed: No  Procedures   MEDICATIONS ORDERED IN ED: Medications  pantoprazole (PROTONIX) injection 40 mg (has no administration in time range)  pantoprazole (PROTONIX) injection 40 mg (40 mg Intravenous Given 08/11/22 1223)     IMPRESSION / MDM / ASSESSMENT AND PLAN / ED  COURSE  I reviewed the triage vital signs and the nursing notes.                              Differential diagnosis includes, but is not limited to, gastrointestinal bleeding, gastritis, hemorrhage, peptic ulcer disease, mass tumor lesion polyp etc.  His clinical history seems to be most suggestive of acute upper GI bleeding with reported hematemesis.  He has no active chest pain he has no signs or symptoms suggestive or he use, mediastinitis, or even acute intra-abdominal pathology as he denies any associate abdominal pain has no pain on examination.  There is no distention.  His stool is also dark and quite heme positive.  He takes no NSAIDs or anticoagulant.    Patient's presentation is most consistent with acute complicated illness / injury requiring diagnostic workup.   The patient is on the cardiac monitor to evaluate for evidence of arrhythmia and/or significant heart rate changes.   Clinical Course as of 08/11/22 1328  Sat Aug 11, 2022  1310 Discussed with GI.  Agree with PPI.   Recommend clear liquid diet, n.p.o. at midnight and will see and evaluate for possible endoscopy tomorrow.  I think this is a reasonable plan, the patient is hemodynamically stable, his hemoglobin has dropped 2 points from baseline, but he shows no evidence of ongoing active hematemesis.  In fact at this point the patient reports feeling improved wanting to eat and drink at this time. [MQ]    Clinical Course User Index [MQ] Sharyn Creamer, MD   Labs interpreted remarkable for approximately 2 point hemoglobin drop from baseline.  Minimally elevated creatinine.  Normal INR  Discussed with GI, patient will be admitted to the hospitalist service.  Consulted with and patient admitted to the service of Dr. Huel Cote  Patient understanding agreeable with plan for clear liquid diet and anticipation of endoscopy tomorrow  FINAL CLINICAL IMPRESSION(S) / ED DIAGNOSES   Final diagnoses:  Gastrointestinal hemorrhage, unspecified gastrointestinal hemorrhage type  Melena     Rx / DC Orders   ED Discharge Orders     None        Note:  This document was prepared using Dragon voice recognition software and may include unintentional dictation errors.   Sharyn Creamer, MD 08/11/22 1329

## 2022-08-11 NOTE — ED Notes (Signed)
Advised nurse that patient has ready bed 

## 2022-08-11 NOTE — Assessment & Plan Note (Addendum)
-   A1c pending - Hold home oral antiglycemic agents - SSI, sensitive given on clear liquid diet

## 2022-08-11 NOTE — Assessment & Plan Note (Signed)
-   Restart home Cogentin, Prolixin, lithium and Seroquel

## 2022-08-12 ENCOUNTER — Observation Stay: Payer: Medicare Other | Admitting: Anesthesiology

## 2022-08-12 ENCOUNTER — Encounter: Payer: Self-pay | Admitting: Internal Medicine

## 2022-08-12 ENCOUNTER — Encounter
Admission: EM | Disposition: A | Discharge status: Home / Self Care | Payer: Self-pay | Source: Home / Self Care | Attending: Emergency Medicine

## 2022-08-12 DIAGNOSIS — K922 Gastrointestinal hemorrhage, unspecified: Secondary | ICD-10-CM | POA: Diagnosis not present

## 2022-08-12 DIAGNOSIS — D62 Acute posthemorrhagic anemia: Secondary | ICD-10-CM | POA: Diagnosis not present

## 2022-08-12 HISTORY — PX: ESOPHAGOGASTRODUODENOSCOPY (EGD) WITH PROPOFOL: SHX5813

## 2022-08-12 LAB — CBC
HCT: 34.7 % — ABNORMAL LOW (ref 39.0–52.0)
Hemoglobin: 11.3 g/dL — ABNORMAL LOW (ref 13.0–17.0)
MCH: 26.5 pg (ref 26.0–34.0)
MCHC: 32.6 g/dL (ref 30.0–36.0)
MCV: 81.5 fL (ref 80.0–100.0)
Platelets: 219 10*3/uL (ref 150–400)
RBC: 4.26 MIL/uL (ref 4.22–5.81)
RDW: 14.4 % (ref 11.5–15.5)
WBC: 4.6 10*3/uL (ref 4.0–10.5)
nRBC: 0 % (ref 0.0–0.2)

## 2022-08-12 LAB — BASIC METABOLIC PANEL
Anion gap: 4 — ABNORMAL LOW (ref 5–15)
BUN: 12 mg/dL (ref 6–20)
CO2: 25 mmol/L (ref 22–32)
Calcium: 9.2 mg/dL (ref 8.9–10.3)
Chloride: 108 mmol/L (ref 98–111)
Creatinine, Ser: 1.14 mg/dL (ref 0.61–1.24)
GFR, Estimated: >60 mL/min (ref >60)
Glucose, Bld: 103 mg/dL — ABNORMAL HIGH (ref 70–99)
Potassium: 3.5 mmol/L (ref 3.5–5.1)
Sodium: 137 mmol/L (ref 135–145)

## 2022-08-12 LAB — HIV ANTIBODY (ROUTINE TESTING W REFLEX): HIV Screen 4th Generation wRfx: NONREACTIVE

## 2022-08-12 LAB — GLUCOSE, CAPILLARY
Glucose-Capillary: 96 mg/dL (ref 70–99)
Glucose-Capillary: 91 mg/dL (ref 70–99)

## 2022-08-12 SURGERY — ESOPHAGOGASTRODUODENOSCOPY (EGD) WITH PROPOFOL
Anesthesia: General

## 2022-08-12 MED ORDER — DEXMEDETOMIDINE HCL IN NACL 200 MCG/50ML IV SOLN
INTRAVENOUS | Status: DC | PRN
  Administered 2022-08-12: 12 ug via INTRAVENOUS

## 2022-08-12 MED ORDER — PROPOFOL 500 MG/50ML IV EMUL
INTRAVENOUS | Status: DC | PRN
  Administered 2022-08-12: 150 ug/kg/min via INTRAVENOUS

## 2022-08-12 MED ORDER — OMEPRAZOLE 40 MG PO CPDR: 40.0000 mg | DELAYED_RELEASE_CAPSULE | Freq: Two times a day (BID) | ORAL | 1 refills | Status: AC

## 2022-08-12 MED ORDER — GLYCOPYRROLATE 0.2 MG/ML IJ SOLN
INTRAMUSCULAR | Status: AC
  Filled 2022-08-12: qty 1

## 2022-08-12 MED ORDER — DEXMEDETOMIDINE HCL IN NACL 80 MCG/20ML IV SOLN
INTRAVENOUS | Status: AC
  Filled 2022-08-12: qty 20

## 2022-08-12 MED ORDER — SODIUM CHLORIDE 0.9 % IV SOLN: Freq: Once | INTRAVENOUS | Status: AC

## 2022-08-12 MED ORDER — SODIUM CHLORIDE 0.9 % IV SOLN: INTRAVENOUS | Status: DC | PRN

## 2022-08-12 MED ORDER — GLYCOPYRROLATE 0.2 MG/ML IJ SOLN
INTRAMUSCULAR | Status: DC | PRN
  Administered 2022-08-12: .2 mg via INTRAVENOUS

## 2022-08-12 MED ORDER — LIDOCAINE HCL (CARDIAC) PF 100 MG/5ML IV SOSY
PREFILLED_SYRINGE | INTRAVENOUS | Status: DC | PRN
  Administered 2022-08-12: 80 mg via INTRAVENOUS

## 2022-08-12 MED ORDER — PROPOFOL 10 MG/ML IV BOLUS
INTRAVENOUS | Status: DC | PRN
  Administered 2022-08-12: 100 mg via INTRAVENOUS

## 2022-08-12 MED ORDER — PROPOFOL 10 MG/ML IV BOLUS
INTRAVENOUS | Status: AC
  Filled 2022-08-12: qty 40

## 2022-08-12 NOTE — H&P (View-Only) (Signed)
Inpatient Consultation   Patient ID: Jonathan Cabrera is a 52 y.o. male.  Requesting Provider: Sharyn Creamer, MD (EM)  Date of Admission: 08/11/2022  Date of Consult: 08/12/22   Reason for Consultation: hematemesis   Patient's Chief Complaint:   Chief Complaint  Patient presents with   Emesis    53 year old African-American male with CKD 3, hypertension, bipolar/schizoaffective disorder, AOCD who presents to the hospital with hematemesis for which GI is consulted.  Patient notes that 2230 on 4/26 he started having nausea and vomiting.  After multiple episodes of emesis he noted blood with this.  He notes that his stools have been dark as of late as well.  The symptoms continued on throughout the night, but have not occurred during his hospitalization.  Denies any dysphagia odynophagia or reflux.  He denies any abdominal pain, weight loss, hematochezia.  No chest pain or shortness of breath.  On presentation his vital signs been stable.  His hemoglobin is 11.3 baseline appears to be between 13-14.  On presentation his hemoglobin is 14.8 which may be related to hemoconcentration.  BUN 25 creatinine 1.25 on presentation.  No other acute GI complaints  Denies NSAIDs, Anti-plt agents, and anticoagulants Denies family history of gastrointestinal disease and malignancy Previous Endoscopies: He believes he had a EGD sometime ago but cannot pinpoint to date.  He does not believe he has had a colonoscopy   Past Medical History:  Diagnosis Date   Anxiety    Bipolar 1 disorder (HCC)    Depression    GERD (gastroesophageal reflux disease)    Schizoaffective disorder (HCC)    TBI (traumatic brain injury) (HCC)     History reviewed. No pertinent surgical history.  Allergies  Allergen Reactions   Haldol [Haloperidol] Nausea And Vomiting   Erythromycin Rash   Penicillins Rash    History reviewed. No pertinent family history.  Social History   Tobacco Use   Smoking status: Never    Smokeless tobacco: Never  Substance Use Topics   Alcohol use: Never   Drug use: Never     Pertinent GI related history and allergies were reviewed with the patient  Review of Systems  Constitutional:  Negative for activity change, appetite change, chills, diaphoresis, fatigue, fever and unexpected weight change.  HENT:  Negative for trouble swallowing and voice change.   Respiratory:  Negative for shortness of breath and wheezing.   Cardiovascular:  Negative for chest pain, palpitations and leg swelling.  Gastrointestinal:  Positive for blood in stool (dark stools reported), nausea and vomiting. Negative for abdominal distention, abdominal pain, anal bleeding, constipation and diarrhea.  Musculoskeletal:  Negative for arthralgias and myalgias.  Skin:  Negative for color change and pallor.  Neurological:  Negative for dizziness, syncope and weakness.  Psychiatric/Behavioral:  Negative for confusion. The patient is not nervous/anxious.   All other systems reviewed and are negative.   Medications Home Medications No current facility-administered medications on file prior to encounter.   Current Outpatient Medications on File Prior to Encounter  Medication Sig Dispense Refill   amLODipine (NORVASC) 10 MG tablet TAKE 1 TABLET BY MOUTH ONCE DAILY (Patient taking differently: Take 10 mg by mouth in the morning.) 90 tablet 1   benztropine (COGENTIN) 1 MG tablet Take 1 mg by mouth 2 (two) times daily.     Cholecalciferol (VITAMIN D-3) 125 MCG (5000 UT) TABS Take 5,000 Units by mouth in the morning.     dapagliflozin propanediol (FARXIGA) 5 MG TABS tablet Take  5 mg by mouth every morning.     esomeprazole (NEXIUM) 40 MG capsule Take 40 mg by mouth in the morning.     ferrous sulfate 325 (65 FE) MG tablet Take 325 mg by mouth in the morning.     fluPHENAZine (PROLIXIN) 5 MG tablet Take 10 mg by mouth in the morning.     fluPHENAZine (PROLIXIN) 5 MG tablet Take 15 mg by mouth at bedtime.       hydrochlorothiazide (HYDRODIURIL) 25 MG tablet Take 25 mg by mouth in the morning.     hydrOXYzine (ATARAX/VISTARIL) 25 MG tablet Take 25 mg by mouth every evening.     lithium carbonate (LITHOBID) 300 MG CR tablet Take 600 mg by mouth at bedtime.     lithium carbonate (LITHOBID) 300 MG ER tablet Take 300 mg by mouth in the morning.     Melatonin 3 MG TABS Take 6 mg by mouth at bedtime.     polyethylene glycol powder (GLYCOLAX/MIRALAX) 17 GM/SCOOP powder Take 8.5 g by mouth every Monday, Wednesday, and Friday. 3350 g 1   QUEtiapine (SEROQUEL) 100 MG tablet Take 100 mg by mouth in the morning.     QUEtiapine (SEROQUEL) 100 MG tablet Take 100 mg by mouth at bedtime. (Take with 300mg  tablet to equal 400mg  total)     QUEtiapine (SEROQUEL) 300 MG tablet Take 300 mg by mouth at bedtime. (Take with 100mg  tablet to equal 400mg  total)     rosuvastatin (CRESTOR) 20 MG tablet Take 20 mg by mouth at bedtime.     telmisartan (MICARDIS) 20 MG tablet Take 20 mg by mouth in the morning.     Pertinent GI related medications were reviewed with the patient  Inpatient Medications  Current Facility-Administered Medications:    acetaminophen (TYLENOL) tablet 650 mg, 650 mg, Oral, Q6H PRN **OR** acetaminophen (TYLENOL) suppository 650 mg, 650 mg, Rectal, Q6H PRN, Verdene Lennert, MD   amLODipine (NORVASC) tablet 10 mg, 10 mg, Oral, q AM, Verdene Lennert, MD   benztropine (COGENTIN) tablet 1 mg, 1 mg, Oral, BID, Verdene Lennert, MD, 1 mg at 08/11/22 2239   fluPHENAZine (PROLIXIN) tablet 10 mg, 10 mg, Oral, q AM, Verdene Lennert, MD   fluPHENAZine (PROLIXIN) tablet 15 mg, 15 mg, Oral, QHS, Verdene Lennert, MD, 15 mg at 08/11/22 2233   hydrOXYzine (ATARAX) tablet 25 mg, 25 mg, Oral, QPM, Verdene Lennert, MD, 25 mg at 08/11/22 2235   insulin aspart (novoLOG) injection 0-9 Units, 0-9 Units, Subcutaneous, TID WC, Verdene Lennert, MD   irbesartan (AVAPRO) tablet 75 mg, 75 mg, Oral, Daily, Verdene Lennert, MD   lithium  carbonate (LITHOBID) ER tablet 300 mg, 300 mg, Oral, q AM, Verdene Lennert, MD   lithium carbonate (LITHOBID) ER tablet 600 mg, 600 mg, Oral, QHS, Verdene Lennert, MD, 600 mg at 08/11/22 2304   ondansetron (ZOFRAN) tablet 4 mg, 4 mg, Oral, Q6H PRN **OR** ondansetron (ZOFRAN) injection 4 mg, 4 mg, Intravenous, Q6H PRN, Verdene Lennert, MD   pantoprazole (PROTONIX) injection 40 mg, 40 mg, Intravenous, Q12H, [name], DO, 40 mg at 08/11/22 1826   QUEtiapine (SEROQUEL) tablet 100 mg, 100 mg, Oral, q AM, Verdene Lennert, MD   QUEtiapine (SEROQUEL) tablet 100 mg, 100 mg, Oral, QHS, Verdene Lennert, MD, 100 mg at 08/11/22 2234   QUEtiapine (SEROQUEL) tablet 300 mg, 300 mg, Oral, QHS, Verdene Lennert, MD, 300 mg at 08/11/22 2233   rosuvastatin (CRESTOR) tablet 20 mg, 20 mg, Oral, QHS, Verdene Lennert, MD, 20 mg  at 08/11/22 2235   sodium chloride flush (NS) 0.9 % injection 3 mL, 3 mL, Intravenous, Q12H, Verdene Lennert, MD, 3 mL at 08/11/22 2236   acetaminophen **OR** acetaminophen, ondansetron **OR** ondansetron (ZOFRAN) IV   Objective   Vitals:   08/11/22 1502 08/11/22 1618 08/11/22 1714 08/11/22 2229  BP:  115/77 118/80 120/81  Pulse:  62 88 65  Resp:  16 16 16   Temp: 98.6 F (37 C) 98.4 F (36.9 C) 98.2 F (36.8 C) 98.1 F (36.7 C)  TempSrc: Oral Oral Oral   SpO2:  100% 100% 100%  Weight:      Height:         Physical Exam Vitals and nursing note reviewed.  Constitutional:      General: He is not in acute distress.    Appearance: Normal appearance. He is not ill-appearing, toxic-appearing or diaphoretic.  HENT:     Head: Normocephalic and atraumatic.     Nose: Nose normal.     Mouth/Throat:     Mouth: Mucous membranes are moist.     Pharynx: Oropharynx is clear.  Eyes:     General: No scleral icterus.    Extraocular Movements: Extraocular movements intact.  Cardiovascular:     Rate and Rhythm: Normal rate and regular rhythm.     Heart sounds: Normal heart  sounds. No murmur heard.    No friction rub. No gallop.  Pulmonary:     Effort: Pulmonary effort is normal. No respiratory distress.     Breath sounds: Normal breath sounds. No wheezing, rhonchi or rales.  Abdominal:     General: Bowel sounds are normal. There is no distension.     Palpations: Abdomen is soft.     Tenderness: There is no abdominal tenderness. There is no guarding or rebound.  Musculoskeletal:     Cervical back: Neck supple.     Right lower leg: No edema.     Left lower leg: No edema.  Skin:    General: Skin is warm and dry.     Coloration: Skin is not jaundiced or pale.  Neurological:     Mental Status: He is alert and oriented to person, place, and time. Mental status is at baseline.  Psychiatric:        Behavior: Behavior normal.        Thought Content: Thought content normal.        Judgment: Judgment normal.     Laboratory Data Recent Labs  Lab 08/11/22 1109 08/11/22 1228 08/12/22 0410  WBC 6.4 8.2 4.6  HGB 12.1* 12.3* 11.3*  HCT 37.6* 37.8* 34.7*  PLT 269 277 219   Recent Labs  Lab 08/11/22 1228 08/12/22 0410  NA 138 137  K 3.4* 3.5  CL 107 108  CO2 24 25  BUN 22* 12  CALCIUM 9.4 9.2  PROT 7.1  --   BILITOT 0.9  --   ALKPHOS 80  --   ALT 16  --   AST 18  --   GLUCOSE 95 103*   Recent Labs  Lab 08/11/22 1228  INR 1.1    Recent Labs    08/11/22 1228  LIPASE 46        Imaging Studies: No results found.  Assessment:  # Hematemesis - occurred after multiple episodes of n/v/dry heaving - ddx includes esophagitis/gastritis, mallory weiss tear, PUD - no abdominal use - no nsaid/oac/antiplt use. No h/o liver disease - denies tobacco use  # Acute on chronic anemia -  hgb 11.3 today - baseline between 13-14 -  bun/cr 22/1.24 on presentation - inr 1.1 - 2/2 above  # GERD  # CKD3 # DM2 # BPD, schizoaffective disorder   Plan:  Esophagogastroduodenoscopy planned for today pending patient stability and endoscopy suite  availability NPO since midnight Morning labs reviewed- hgb 11.3 today Protonix 40 mg iv q12 h Hold dvt ppx Monitor H&H.  Transfusion and resuscitation as per primary team Avoid frequent lab draws to prevent lab induced anemia Supportive care and antiemetics as per primary team Maintain two sites IV access Avoid nsaids Monitor for GIB.  Further recommendations pending endoscopy. Please see op report for further details  Esophagogastroduodenoscopy with possible biopsy, control of bleeding, polypectomy, and interventions as necessary has been discussed with the patient/patient representative. Informed consent was obtained from the patient/patient representative after explaining the indication, nature, and risks of the procedure including but not limited to death, bleeding, perforation, missed neoplasm/lesions, cardiorespiratory compromise, and reaction to medications. Opportunity for questions was given and appropriate answers were provided. Patient/patient representative has verbalized understanding is amenable to undergoing the procedure.   I personally performed the service.  Management of other medical comorbidities as per primary team  Thank you for allowing [place] to participate in this patient's care. Please don't hesitate to call if any questions or concerns arise.   [name], DO [facility]  Portions of the record may have been created with voice recognition software. Occasional wrong-word or 'sound-a-like' substitutions may have occurred due to the inherent limitations of voice recognition software.  Read the chart carefully and recognize, using context, where substitutions may have occurred.

## 2022-08-12 NOTE — Anesthesia Postprocedure Evaluation (Signed)
Anesthesia Post Note  Patient: [name]  Procedure(s) Performed: ESOPHAGOGASTRODUODENOSCOPY (EGD) WITH PROPOFOL  Patient location during evaluation: Endoscopy Anesthesia Type: General Level of consciousness: awake and alert Pain management: pain level controlled Vital Signs Assessment: post-procedure vital signs reviewed and stable Respiratory status: spontaneous breathing, nonlabored ventilation, respiratory function stable and patient connected to nasal cannula oxygen Cardiovascular status: blood pressure returned to baseline and stable Postop Assessment: no apparent nausea or vomiting Anesthetic complications: no   No notable events documented.   Last Vitals:  Vitals:   08/12/22 1029 08/12/22 1106  BP: 104/73 107/72  Pulse:  60  Resp:  18  Temp:  36.7 C  SpO2:  100%    Last Pain:  Vitals:   08/12/22 1029  TempSrc:   PainSc: 0-No pain                 Corinda Gubler

## 2022-08-12 NOTE — Discharge Summary (Signed)
Physician Discharge Summary   Patient: Jonathan Cabrera MRN: [id number] DOB: 14-Apr-1971  Admit date:     08/11/2022  Discharge date: 08/12/22  Discharge Physician: Lurene Shadow   PCP: Franciso Bend, NP   Recommendations at discharge:   Follow-up with PCP in 1 to 2 weeks Follow-up with Dr. Perlie Gold, gastroenterologist, in 2 months for repeat CT EGD  Discharge Diagnoses: Principal Problem:   Acute upper GI bleed Active Problems:   Acute blood loss anemia   Essential hypertension   Bipolar 1 disorder (HCC)   Chronic kidney disease, stage 3a (HCC)   Type 2 diabetes mellitus with diabetic chronic kidney disease (HCC)  Resolved Problems:   * No resolved hospital problems. [facility] Course:  Jonathan Cabrera is a 52 y.o. male with medical history significant of CKD stage IIIa, gastritis, hypertension, hypercalcemia, bipolar/schizoaffective disorder, anemia of chronic disease, who presented to the hospital because of hematemesis.  He developed sudden onset of nausea and vomiting the night prior to admission.  Vomitus was black in color and subsequently turned red.  He was admitted to the hospital with acute upper GI bleeding complicated by mild acute blood loss anemia.  Hemoglobin dropped from 12.3-11.3.  He was treated with IV Protonix and IV fluids.  He was evaluated by the gastroenterologist.  He underwent EGD which showed single gastric polyp, gastritis, 4 cm hiatal hernia, LA grade a esophagitis with no bleeding.  Prilosec (omeprazole) 40 mg twice daily for 8 weeks was recommended.  Follow-up with gastroenterologist for repeat EGD to evaluate response to therapy.  His condition has improved and he is deemed stable for discharge today.  Discharge plan was discussed with his uncle, Jonathan Cabrera, at the bedside.         Consultants: Gastroenterologist Procedures performed: EGD Disposition: Group home Diet recommendation:  Discharge Diet Orders (From admission, onward)      Start     Ordered   08/12/22 0000  Diet - low sodium heart healthy        08/12/22 1159           Cardiac diet DISCHARGE MEDICATION: Allergies as of 08/12/2022       Reactions   Haldol [haloperidol] Nausea And Vomiting   Erythromycin Rash   Penicillins Rash        Medication List     STOP taking these medications    esomeprazole 40 MG capsule Commonly known as: NEXIUM       TAKE these medications    amLODipine 10 MG tablet Commonly known as: NORVASC TAKE 1 TABLET BY MOUTH ONCE DAILY What changed: when to take this   benztropine 1 MG tablet Commonly known as: COGENTIN Take 1 mg by mouth 2 (two) times daily.   dapagliflozin propanediol 5 MG Tabs tablet Commonly known as: FARXIGA Take 5 mg by mouth every morning.   ferrous sulfate 325 (65 FE) MG tablet Take 325 mg by mouth in the morning.   fluPHENAZine 5 MG tablet Commonly known as: PROLIXIN Take 10 mg by mouth in the morning.   fluPHENAZine 5 MG tablet Commonly known as: PROLIXIN Take 15 mg by mouth at bedtime.   hydrochlorothiazide 25 MG tablet Commonly known as: HYDRODIURIL Take 25 mg by mouth in the morning.   hydrOXYzine 25 MG tablet Commonly known as: ATARAX Take 25 mg by mouth every evening.   lithium carbonate 300 MG ER tablet Commonly known as: LITHOBID Take 600 mg by mouth at bedtime.   lithium  carbonate 300 MG ER tablet Commonly known as: LITHOBID Take 300 mg by mouth in the morning.   melatonin 3 MG Tabs tablet Take 6 mg by mouth at bedtime.   omeprazole 40 MG capsule Commonly known as: PRILOSEC Take 1 capsule (40 mg total) by mouth 2 (two) times daily.   polyethylene glycol powder 17 GM/SCOOP powder Commonly known as: GLYCOLAX/MIRALAX Take 8.5 g by mouth every Monday, Wednesday, and Friday.   QUEtiapine 100 MG tablet Commonly known as: SEROQUEL Take 100 mg by mouth in the morning.   QUEtiapine 300 MG tablet Commonly known as: SEROQUEL Take 300 mg by mouth at bedtime.  (Take with 100mg  tablet to equal 400mg  total)   QUEtiapine 100 MG tablet Commonly known as: SEROQUEL Take 100 mg by mouth at bedtime. (Take with 300mg  tablet to equal 400mg  total)   rosuvastatin 20 MG tablet Commonly known as: CRESTOR Take 20 mg by mouth at bedtime.   telmisartan 20 MG tablet Commonly known as: MICARDIS Take 20 mg by mouth in the morning.   Vitamin D-3 125 MCG (5000 UT) Tabs Take 5,000 Units by mouth in the morning.        Follow-up Information     [name], DO. Schedule an appointment as soon as possible for a visit in 2 month(s).   Specialty: Gastroenterology Contact information: [address] [place] [address] [phone]                Discharge Exam: Jonathan Cabrera Weights   08/11/22 1103  Weight: 112.5 kg   GEN: NAD SKIN: Warm and dry EYES: No pallor or icterus ENT: MMM CV: RRR PULM: CTA B ABD: soft, obese, NT, +BS CNS: AAO x 3, non focal EXT: No edema or tenderness   Condition at discharge: good  The results of significant diagnostics from this hospitalization (including imaging, microbiology, ancillary and laboratory) are listed below for reference.   Imaging Studies: No results found.  Microbiology: No results found for this or any previous visit.  Labs: CBC: Recent Labs  Lab 08/11/22 1109 08/11/22 1228 08/12/22 0410  WBC 6.4 8.2 4.6  HGB 12.1* 12.3* 11.3*  HCT 37.6* 37.8* 34.7*  MCV 81.6 80.9 81.5  PLT 269 277 219   Basic Metabolic Panel: Recent Labs  Lab 08/11/22 1228 08/12/22 0410  NA 138 137  K 3.4* 3.5  CL 107 108  CO2 24 25  GLUCOSE 95 103*  BUN 22* 12  CREATININE 1.25* 1.14  CALCIUM 9.4 9.2   Liver Function Tests: Recent Labs  Lab 08/11/22 1228  AST 18  ALT 16  ALKPHOS 80  BILITOT 0.9  PROT 7.1  ALBUMIN 4.0   CBG: Recent Labs  Lab 08/11/22 1713 08/11/22 2231 08/12/22 0847 08/12/22 1126  GLUCAP 70 88 96 91    Discharge time spent: greater than 30  minutes.  Signed: Lurene Shadow, MD [facility] 08/12/2022

## 2022-08-12 NOTE — Transfer of Care (Signed)
Immediate Anesthesia Transfer of Care Note  Patient: Jonathan Cabrera  Procedure(s) Performed: Procedure(s): ESOPHAGOGASTRODUODENOSCOPY (EGD) WITH PROPOFOL (N/A)  Patient Location: PACU and Endoscopy Unit  Anesthesia Type:General  Level of Consciousness: sedated  Airway & Oxygen Therapy: Patient Spontanous Breathing and Patient connected to nasal cannula oxygen  Post-op Assessment: Report given to RN and Post -op Vital signs reviewed and stable  Post vital signs: Reviewed and stable  Last Vitals:  Vitals:   08/12/22 0850 08/12/22 0959  BP: 115/80 98/76  Pulse: 81   Resp: 16 (!) 21  Temp: 36.7 C 36.6 C  SpO2: 99% 97%    Complications: No apparent anesthesia complications

## 2022-08-12 NOTE — Consult Note (Addendum)
  Inpatient Consultation   Patient ID: Jonathan Cabrera is a 52 y.o. male.  Requesting Provider: Mark Quale, MD (EM)  Date of Admission: 08/11/2022  Date of Consult: 08/12/22   Reason for Consultation: hematemesis   Patient's Chief Complaint:   Chief Complaint  Patient presents with   Emesis    52-year-old African-American male with CKD 3, hypertension, bipolar/schizoaffective disorder, AOCD who presents to the hospital with hematemesis for which GI is consulted.  Patient notes that 2230 on 4/26 he started having nausea and vomiting.  After multiple episodes of emesis he noted blood with this.  He notes that his stools have been dark as of late as well.  The symptoms continued on throughout the night, but have not occurred during his hospitalization.  Denies any dysphagia odynophagia or reflux.  He denies any abdominal pain, weight loss, hematochezia.  No chest pain or shortness of breath.  On presentation his vital signs been stable.  His hemoglobin is 11.3 baseline appears to be between 13-14.  On presentation his hemoglobin is 14.8 which may be related to hemoconcentration.  BUN 25 creatinine 1.25 on presentation.  No other acute GI complaints  Denies NSAIDs, Anti-plt agents, and anticoagulants Denies family history of gastrointestinal disease and malignancy Previous Endoscopies: He believes he had a EGD sometime ago but cannot pinpoint to date.  He does not believe he has had a colonoscopy   Past Medical History:  Diagnosis Date   Anxiety    Bipolar 1 disorder (HCC)    Depression    GERD (gastroesophageal reflux disease)    Schizoaffective disorder (HCC)    TBI (traumatic brain injury) (HCC)     History reviewed. No pertinent surgical history.  Allergies  Allergen Reactions   Haldol [Haloperidol] Nausea And Vomiting   Erythromycin Rash   Penicillins Rash    History reviewed. No pertinent family history.  Social History   Tobacco Use   Smoking status: Never    Smokeless tobacco: Never  Substance Use Topics   Alcohol use: Never   Drug use: Never     Pertinent GI related history and allergies were reviewed with the patient  Review of Systems  Constitutional:  Negative for activity change, appetite change, chills, diaphoresis, fatigue, fever and unexpected weight change.  HENT:  Negative for trouble swallowing and voice change.   Respiratory:  Negative for shortness of breath and wheezing.   Cardiovascular:  Negative for chest pain, palpitations and leg swelling.  Gastrointestinal:  Positive for blood in stool (dark stools reported), nausea and vomiting. Negative for abdominal distention, abdominal pain, anal bleeding, constipation and diarrhea.  Musculoskeletal:  Negative for arthralgias and myalgias.  Skin:  Negative for color change and pallor.  Neurological:  Negative for dizziness, syncope and weakness.  Psychiatric/Behavioral:  Negative for confusion. The patient is not nervous/anxious.   All other systems reviewed and are negative.   Medications Home Medications No current facility-administered medications on file prior to encounter.   Current Outpatient Medications on File Prior to Encounter  Medication Sig Dispense Refill   amLODipine (NORVASC) 10 MG tablet TAKE 1 TABLET BY MOUTH ONCE DAILY (Patient taking differently: Take 10 mg by mouth in the morning.) 90 tablet 1   benztropine (COGENTIN) 1 MG tablet Take 1 mg by mouth 2 (two) times daily.     Cholecalciferol (VITAMIN D-3) 125 MCG (5000 UT) TABS Take 5,000 Units by mouth in the morning.     dapagliflozin propanediol (FARXIGA) 5 MG TABS tablet Take   5 mg by mouth every morning.     esomeprazole (NEXIUM) 40 MG capsule Take 40 mg by mouth in the morning.     ferrous sulfate 325 (65 FE) MG tablet Take 325 mg by mouth in the morning.     fluPHENAZine (PROLIXIN) 5 MG tablet Take 10 mg by mouth in the morning.     fluPHENAZine (PROLIXIN) 5 MG tablet Take 15 mg by mouth at bedtime.       hydrochlorothiazide (HYDRODIURIL) 25 MG tablet Take 25 mg by mouth in the morning.     hydrOXYzine (ATARAX/VISTARIL) 25 MG tablet Take 25 mg by mouth every evening.     lithium carbonate (LITHOBID) 300 MG CR tablet Take 600 mg by mouth at bedtime.     lithium carbonate (LITHOBID) 300 MG ER tablet Take 300 mg by mouth in the morning.     Melatonin 3 MG TABS Take 6 mg by mouth at bedtime.     polyethylene glycol powder (GLYCOLAX/MIRALAX) 17 GM/SCOOP powder Take 8.5 g by mouth every Monday, Wednesday, and Friday. 3350 g 1   QUEtiapine (SEROQUEL) 100 MG tablet Take 100 mg by mouth in the morning.     QUEtiapine (SEROQUEL) 100 MG tablet Take 100 mg by mouth at bedtime. (Take with 300mg tablet to equal 400mg total)     QUEtiapine (SEROQUEL) 300 MG tablet Take 300 mg by mouth at bedtime. (Take with 100mg tablet to equal 400mg total)     rosuvastatin (CRESTOR) 20 MG tablet Take 20 mg by mouth at bedtime.     telmisartan (MICARDIS) 20 MG tablet Take 20 mg by mouth in the morning.     Pertinent GI related medications were reviewed with the patient  Inpatient Medications  Current Facility-Administered Medications:    acetaminophen (TYLENOL) tablet 650 mg, 650 mg, Oral, Q6H PRN **OR** acetaminophen (TYLENOL) suppository 650 mg, 650 mg, Rectal, Q6H PRN, Basaraba, Iulia, MD   amLODipine (NORVASC) tablet 10 mg, 10 mg, Oral, q AM, Basaraba, Iulia, MD   benztropine (COGENTIN) tablet 1 mg, 1 mg, Oral, BID, Basaraba, Iulia, MD, 1 mg at 08/11/22 2239   fluPHENAZine (PROLIXIN) tablet 10 mg, 10 mg, Oral, q AM, Basaraba, Iulia, MD   fluPHENAZine (PROLIXIN) tablet 15 mg, 15 mg, Oral, QHS, Basaraba, Iulia, MD, 15 mg at 08/11/22 2233   hydrOXYzine (ATARAX) tablet 25 mg, 25 mg, Oral, QPM, Basaraba, Iulia, MD, 25 mg at 08/11/22 2235   insulin aspart (novoLOG) injection 0-9 Units, 0-9 Units, Subcutaneous, TID WC, Basaraba, Iulia, MD   irbesartan (AVAPRO) tablet 75 mg, 75 mg, Oral, Daily, Basaraba, Iulia, MD   lithium  carbonate (LITHOBID) ER tablet 300 mg, 300 mg, Oral, q AM, Basaraba, Iulia, MD   lithium carbonate (LITHOBID) ER tablet 600 mg, 600 mg, Oral, QHS, Basaraba, Iulia, MD, 600 mg at 08/11/22 2304   ondansetron (ZOFRAN) tablet 4 mg, 4 mg, Oral, Q6H PRN **OR** ondansetron (ZOFRAN) injection 4 mg, 4 mg, Intravenous, Q6H PRN, Basaraba, Iulia, MD   pantoprazole (PROTONIX) injection 40 mg, 40 mg, Intravenous, Q12H, Jadis Pitter Michael, DO, 40 mg at 08/11/22 1826   QUEtiapine (SEROQUEL) tablet 100 mg, 100 mg, Oral, q AM, Basaraba, Iulia, MD   QUEtiapine (SEROQUEL) tablet 100 mg, 100 mg, Oral, QHS, Basaraba, Iulia, MD, 100 mg at 08/11/22 2234   QUEtiapine (SEROQUEL) tablet 300 mg, 300 mg, Oral, QHS, Basaraba, Iulia, MD, 300 mg at 08/11/22 2233   rosuvastatin (CRESTOR) tablet 20 mg, 20 mg, Oral, QHS, Basaraba, Iulia, MD, 20 mg   at 08/11/22 2235   sodium chloride flush (NS) 0.9 % injection 3 mL, 3 mL, Intravenous, Q12H, Basaraba, Iulia, MD, 3 mL at 08/11/22 2236   acetaminophen **OR** acetaminophen, ondansetron **OR** ondansetron (ZOFRAN) IV   Objective   Vitals:   08/11/22 1502 08/11/22 1618 08/11/22 1714 08/11/22 2229  BP:  115/77 118/80 120/81  Pulse:  62 88 65  Resp:  16 16 16  Temp: 98.6 F (37 C) 98.4 F (36.9 C) 98.2 F (36.8 C) 98.1 F (36.7 C)  TempSrc: Oral Oral Oral   SpO2:  100% 100% 100%  Weight:      Height:         Physical Exam Vitals and nursing note reviewed.  Constitutional:      General: He is not in acute distress.    Appearance: Normal appearance. He is not ill-appearing, toxic-appearing or diaphoretic.  HENT:     Head: Normocephalic and atraumatic.     Nose: Nose normal.     Mouth/Throat:     Mouth: Mucous membranes are moist.     Pharynx: Oropharynx is clear.  Eyes:     General: No scleral icterus.    Extraocular Movements: Extraocular movements intact.  Cardiovascular:     Rate and Rhythm: Normal rate and regular rhythm.     Heart sounds: Normal heart  sounds. No murmur heard.    No friction rub. No gallop.  Pulmonary:     Effort: Pulmonary effort is normal. No respiratory distress.     Breath sounds: Normal breath sounds. No wheezing, rhonchi or rales.  Abdominal:     General: Bowel sounds are normal. There is no distension.     Palpations: Abdomen is soft.     Tenderness: There is no abdominal tenderness. There is no guarding or rebound.  Musculoskeletal:     Cervical back: Neck supple.     Right lower leg: No edema.     Left lower leg: No edema.  Skin:    General: Skin is warm and dry.     Coloration: Skin is not jaundiced or pale.  Neurological:     Mental Status: He is alert and oriented to person, place, and time. Mental status is at baseline.  Psychiatric:        Behavior: Behavior normal.        Thought Content: Thought content normal.        Judgment: Judgment normal.     Laboratory Data Recent Labs  Lab 08/11/22 1109 08/11/22 1228 08/12/22 0410  WBC 6.4 8.2 4.6  HGB 12.1* 12.3* 11.3*  HCT 37.6* 37.8* 34.7*  PLT 269 277 219   Recent Labs  Lab 08/11/22 1228 08/12/22 0410  NA 138 137  K 3.4* 3.5  CL 107 108  CO2 24 25  BUN 22* 12  CALCIUM 9.4 9.2  PROT 7.1  --   BILITOT 0.9  --   ALKPHOS 80  --   ALT 16  --   AST 18  --   GLUCOSE 95 103*   Recent Labs  Lab 08/11/22 1228  INR 1.1    Recent Labs    08/11/22 1228  LIPASE 46        Imaging Studies: No results found.  Assessment:  # Hematemesis - occurred after multiple episodes of n/v/dry heaving - ddx includes esophagitis/gastritis, mallory weiss tear, PUD - no abdominal use - no nsaid/oac/antiplt use. No h/o liver disease - denies tobacco use  # Acute on chronic anemia -   hgb 11.3 today - baseline between 13-14 -  bun/cr 22/1.24 on presentation - inr 1.1 - 2/2 above  # GERD  # CKD3 # DM2 # BPD, schizoaffective disorder   Plan:  Esophagogastroduodenoscopy planned for today pending patient stability and endoscopy suite  availability NPO since midnight Morning labs reviewed- hgb 11.3 today Protonix 40 mg iv q12 h Hold dvt ppx Monitor H&H.  Transfusion and resuscitation as per primary team Avoid frequent lab draws to prevent lab induced anemia Supportive care and antiemetics as per primary team Maintain two sites IV access Avoid nsaids Monitor for GIB.  Further recommendations pending endoscopy. Please see op report for further details  Esophagogastroduodenoscopy with possible biopsy, control of bleeding, polypectomy, and interventions as necessary has been discussed with the patient/patient representative. Informed consent was obtained from the patient/patient representative after explaining the indication, nature, and risks of the procedure including but not limited to death, bleeding, perforation, missed neoplasm/lesions, cardiorespiratory compromise, and reaction to medications. Opportunity for questions was given and appropriate answers were provided. Patient/patient representative has verbalized understanding is amenable to undergoing the procedure.   I personally performed the service.  Management of other medical comorbidities as per primary team  Thank you for allowing us to participate in this patient's care. Please don't hesitate to call if any questions or concerns arise.   Vianey Caniglia Michael Rehema Muffley, DO [facility]  Portions of the record may have been created with voice recognition software. Occasional wrong-word or 'sound-a-like' substitutions may have occurred due to the inherent limitations of voice recognition software.  Read the chart carefully and recognize, using context, where substitutions may have occurred.  

## 2022-08-12 NOTE — Interval H&P Note (Signed)
History and Physical Interval Note: Preprocedure H&P from 08/12/22  was reviewed and there was no interval change after seeing and examining the patient.  Written consent was obtained from the patient after discussion of risks, benefits, and alternatives. Patient has consented to proceed with Esophagogastroduodenoscopy with possible intervention   08/12/2022 9:17 AM  Jonathan Cabrera  has presented today for surgery, with the diagnosis of hematemesis, melena, anemia.  The various methods of treatment have been discussed with the patient and family. After consideration of risks, benefits and other options for treatment, the patient has consented to  Procedure(s): ESOPHAGOGASTRODUODENOSCOPY (EGD) WITH PROPOFOL (N/A) as a surgical intervention.  The patient's history has been reviewed, patient examined, no change in status, stable for surgery.  I have reviewed the patient's chart and labs.  Questions were answered to the patient's satisfaction.     [name]

## 2022-08-12 NOTE — Anesthesia Procedure Notes (Signed)
Date/Time: 08/12/2022 9:43 AM  Performed by: Stormy Fabian, CRNAPre-anesthesia Checklist: Patient identified, Emergency Drugs available, Suction available and Patient being monitored Patient Re-evaluated:Patient Re-evaluated prior to induction Oxygen Delivery Method: Nasal cannula Induction Type: IV induction Dental Injury: Teeth and Oropharynx as per pre-operative assessment  Comments: Nasal cannula with etCO2 monitoring

## 2022-08-12 NOTE — Op Note (Signed)
[facility] Patient Name: Jonathan Cabrera Procedure Date: 08/12/2022 8:55 AM MRN: [id number] Account #: [id number] Date of Birth: [birth year] Admit Type: Inpatient Age: 52 Room: [facility] ENDO ROOM 4 Gender: Male Note Status: Finalized Instrument Name: Upper Endoscope [id number] Procedure:             Upper GI endoscopy Indications:           Hematemesis Providers:             [name] DO, DO Referring MD:          Lise Auer. Aundria Rud (Referring MD) Medicines:             Monitored Anesthesia Care Complications:         No immediate complications. Estimated blood loss:                         Minimal. Procedure:             Pre-Anesthesia Assessment:                        - Prior to the procedure, a History and Physical was                         performed, and patient medications and allergies were                         reviewed. The patient is competent. The risks and                         benefits of the procedure and the sedation options and                         risks were discussed with the patient. All questions                         were answered and informed consent was obtained.                         Patient identification and proposed procedure were                         verified by the physician, the nurse, the anesthetist                         and the technician in the endoscopy suite. Mental                         Status Examination: alert and oriented. Airway                         Examination: normal oropharyngeal airway and neck                         mobility. Respiratory Examination: clear to                         auscultation. CV Examination: RRR, no murmurs, no S3  or S4. Prophylactic Antibiotics: The patient does not                         require prophylactic antibiotics. Prior                         Anticoagulants: The patient has taken no anticoagulant                          or antiplatelet agents. ASA Grade Assessment: III - A                         patient with severe systemic disease. After reviewing                         the risks and benefits, the patient was deemed in                         satisfactory condition to undergo the procedure. The                         anesthesia plan was to use monitored anesthesia care                         (MAC). Immediately prior to administration of                         medications, the patient was re-assessed for adequacy                         to receive sedatives. The heart rate, respiratory                         rate, oxygen saturations, blood pressure, adequacy of                         pulmonary ventilation, and response to care were                         monitored throughout the procedure. The physical                         status of the patient was re-assessed after the                         procedure.                        After obtaining informed consent, the endoscope was                         passed under direct vision. Throughout the procedure,                         the patient's blood pressure, pulse, and oxygen                         saturations were monitored continuously. The Endoscope  was introduced through the mouth, and advanced to the                         second part of duodenum. The upper GI endoscopy was                         accomplished without difficulty. The patient tolerated                         the procedure well. Findings:      The ampulla, duodenal bulb, first portion of the duodenum and second       portion of the duodenum were normal. Estimated blood loss: none.      A single 4 to 5 mm sessile polyp with no bleeding and no stigmata of       recent bleeding was found in the gastric antrum. Biopsies were taken       with a cold forceps for histology. Estimated blood loss was minimal.      Segmental moderate inflammation  characterized by congestion (edema),       erosions, erythema and granularity was found in the gastric body and in       the gastric antrum. Biopsies were taken with a cold forceps for       histology. Biopsies were taken with a cold forceps for Helicobacter       pylori testing. Estimated blood loss was minimal.      A 4 cm hiatal hernia was present. Estimated blood loss: none.      The Z-line was regular. Estimated blood loss: none.      Esophagogastric landmarks were identified: the gastroesophageal junction       was found at 36 cm from the incisors.      LA Grade A (one or more mucosal breaks less than 5 mm, not extending       between tops of 2 mucosal folds) esophagitis with no bleeding was found.       Estimated blood loss: none.      The exam of the esophagus was otherwise normal. Impression:            - Normal ampulla, duodenal bulb, first portion of the                         duodenum and second portion of the duodenum.                        - A single gastric polyp. Biopsied.                        - Gastritis. Biopsied.                        - 4 cm hiatal hernia.                        - Z-line regular.                        - Esophagogastric landmarks identified.                        - LA Grade A esophagitis with no bleeding. Recommendation:        -  Patient has a contact number available for                         emergencies. The signs and symptoms of potential                         delayed complications were discussed with the patient.                         Return to normal activities tomorrow. Written                         discharge instructions were provided to the patient.                        - Discharge patient to home.                        - Advance diet as tolerated and full liquid diet today.                        - Continue present medications.                        - No ibuprofen, naproxen, or other non-steroidal                          anti-inflammatory drugs.                        - Use Prilosec (omeprazole) 40 mg PO BID for 8 weeks.                        - Await pathology results.                        - Repeat upper endoscopy at appointment to be                         scheduled to evaluate the response to therapy.                        - Return to GI office at appointment to be scheduled.                        - The findings and recommendations were discussed with                         the patient.                        - The findings and recommendations were discussed with                         the referring physician.                        - GI to sign off. Available as needed with call back. Procedure Code(s):     --- Professional ---  78295, Esophagogastroduodenoscopy, flexible,                         transoral; with biopsy, single or multiple Diagnosis Code(s):     --- Professional ---                        K31.7, Polyp of stomach and duodenum                        K29.70, Gastritis, unspecified, without bleeding                        K44.9, Diaphragmatic hernia without obstruction or                         gangrene                        K20.90, Esophagitis, unspecified without bleeding                        K92.0, Hematemesis CPT copyright 2022 [employer]. All rights reserved. The codes documented in this report are preliminary and upon coder review may  be revised to meet current compliance requirements. Attending Participation:      I personally performed the entire procedure. Elfredia Nevins, DO [name] DO, DO 08/12/2022 10:08:42 AM This report has been signed electronically. Number of Addenda: 0 Note Initiated On: 08/12/2022 8:55 AM Estimated Blood Loss:  Estimated blood loss was minimal.      [facility]

## 2022-08-12 NOTE — Anesthesia Preprocedure Evaluation (Signed)
Anesthesia Evaluation  Patient identified by MRN, date of birth, ID band Patient awake  General Assessment Comment:  Hematemesis yesterday. No vomiting or nausea today. Gi concern for potential Mallory Weiss tear.  Reviewed: Allergy & Precautions, NPO status , Patient's Chart, lab work & pertinent test results  History of Anesthesia Complications Negative for: history of anesthetic complications  Airway Mallampati: II  TM Distance: >3 FB Neck ROM: Full    Dental  (+) Missing, Poor Dentition   Pulmonary neg pulmonary ROS, neg sleep apnea, neg COPD, Patient abstained from smoking.Not current smoker   Pulmonary exam normal breath sounds clear to auscultation       Cardiovascular Exercise Tolerance: Good METShypertension, (-) CAD and (-) Past MI (-) dysrhythmias  Rhythm:Regular Rate:Normal - Systolic murmurs    Neuro/Psych  PSYCHIATRIC DISORDERS Anxiety Depression Bipolar Disorder Schizophrenia  negative neurological ROS     GI/Hepatic ,GERD  ,,(+)     (-) substance abuse    Endo/Other  diabetes    Renal/GU CRFRenal disease     Musculoskeletal   Abdominal   Peds  Hematology  (+) REFUSES BLOOD PRODUCTSConfirmed with patient that he refuses blood transfusion even if the refusal meant his death   Anesthesia Other Findings Past Medical History: No date: Anxiety No date: Bipolar 1 disorder (HCC) No date: Depression No date: GERD (gastroesophageal reflux disease) No date: Schizoaffective disorder (HCC) No date: TBI (traumatic brain injury) (HCC)  Reproductive/Obstetrics                             Anesthesia Physical Anesthesia Plan  ASA: 3  Anesthesia Plan: General   Post-op Pain Management: Minimal or no pain anticipated   Induction: Intravenous  PONV Risk Score and Plan: 2 and Propofol infusion, TIVA and Ondansetron  Airway Management Planned: Nasal Cannula  Additional  Equipment: None  Intra-op Plan:   Post-operative Plan:   Informed Consent: I have reviewed the patients History and Physical, chart, labs and discussed the procedure including the risks, benefits and alternatives for the proposed anesthesia with the patient or authorized representative who has indicated his/her understanding and acceptance.     Dental advisory given  Plan Discussed with: CRNA and Surgeon  Anesthesia Plan Comments: (Given no nausea or vomiting since yesterday, low concern for variceal bleed but rather Mallory weiss tear from protracted retching, I deem it reasonable to proceed with natural airway, with low threshold to convert to GETA.  Discussed risks of anesthesia with patient, including possibility of difficulty with spontaneous ventilation under anesthesia necessitating airway intervention, PONV, and rare risks such as cardiac or respiratory or neurological events, and allergic reactions. Discussed the role of CRNA in patient's perioperative care. Patient understands.)       Anesthesia Quick Evaluation

## 2022-08-13 ENCOUNTER — Encounter: Payer: Self-pay | Admitting: Gastroenterology

## 2022-08-14 LAB — SURGICAL PATHOLOGY

## 2022-08-21 ENCOUNTER — Encounter: Payer: Self-pay | Admitting: Gastroenterology

## 2023-03-02 IMAGING — CT CT HEAD W/O CM
3 series · 16 of 47 positions shown, 19 images · non-contrast
Comparison: None.

CLINICAL DATA: Syncope.

EXAM:
CT HEAD WITHOUT CONTRAST
TECHNIQUE: Contiguous axial images were obtained from the base of the skull
through the vertex without intravenous contrast.

[Series 2: head wo · axial · 0.47mm/px · z∈[-158,-13]mm · 10 of 35 slices shown, 13 images]
[im 3/35  brain]
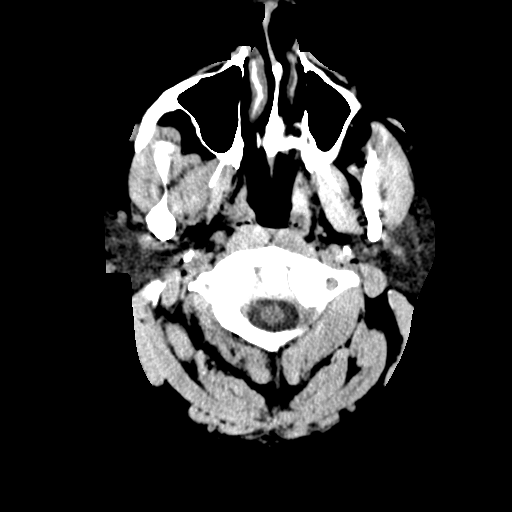
[im 3/35  bone]
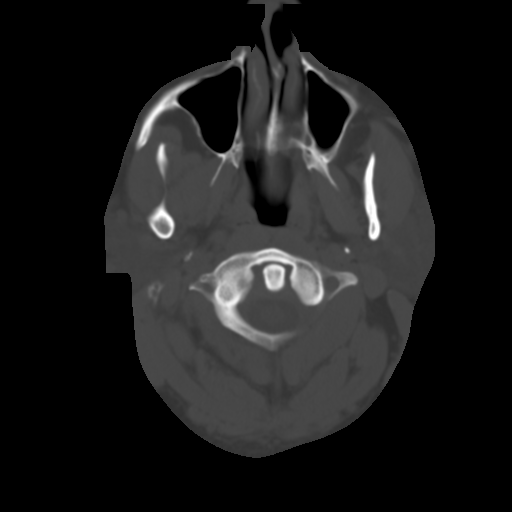
[im 6/35  brain]
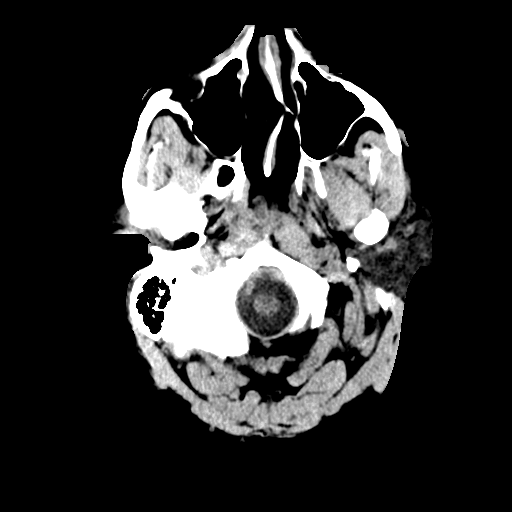
[im 10/35  brain]
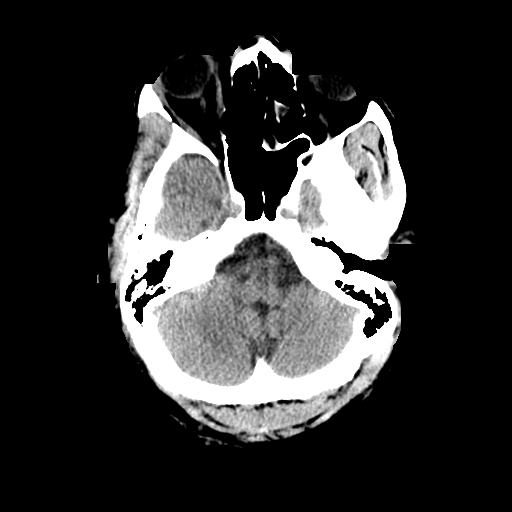
[im 12/35  brain]
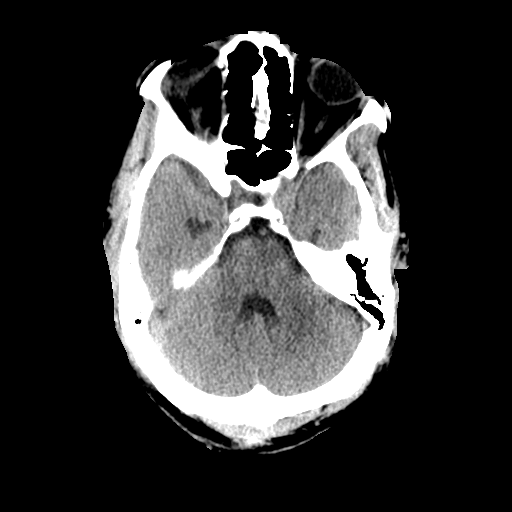
[im 16/35  brain]
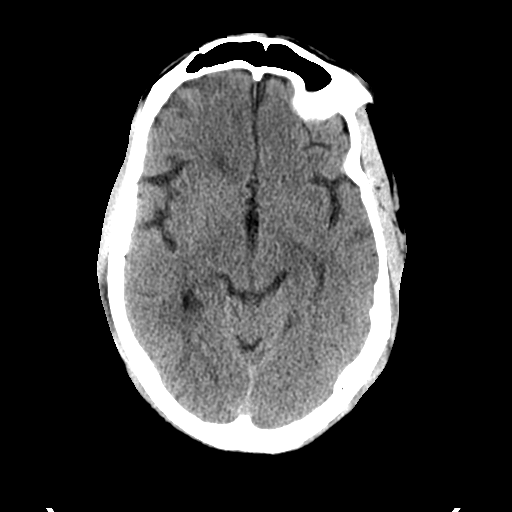
[im 16/35  bone]
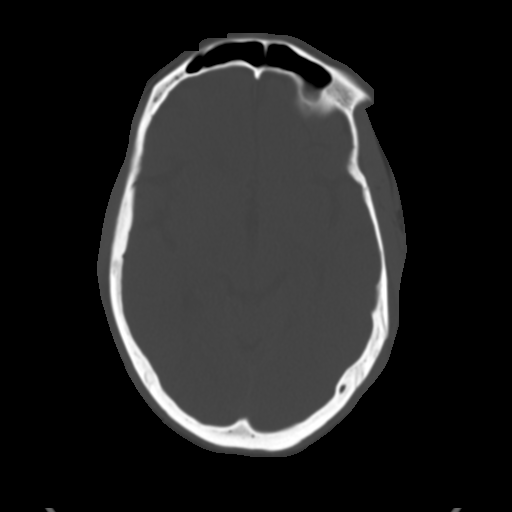
[im 19/35  brain]
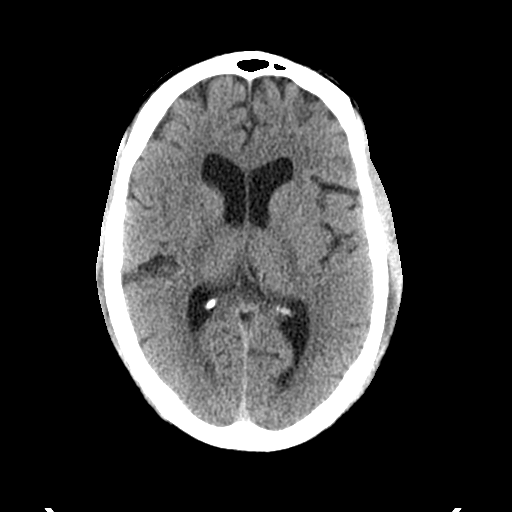
[im 23/35  brain]
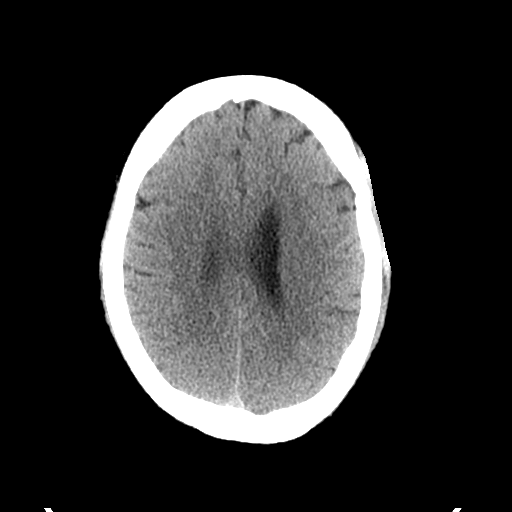
[im 26/35  brain]
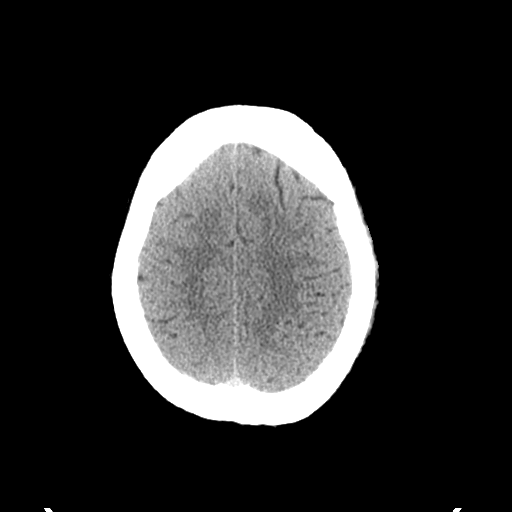
[im 29/35  brain]
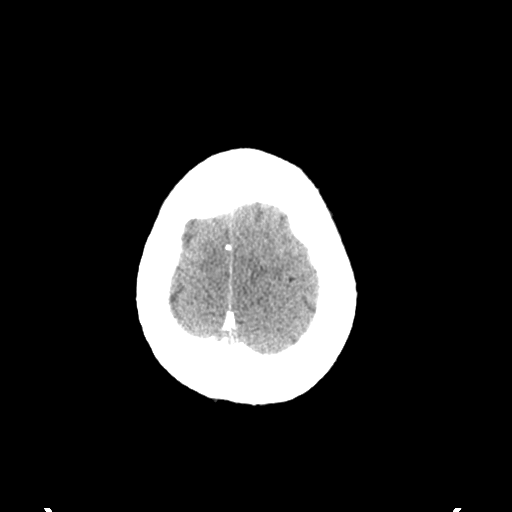
[im 29/35  bone]
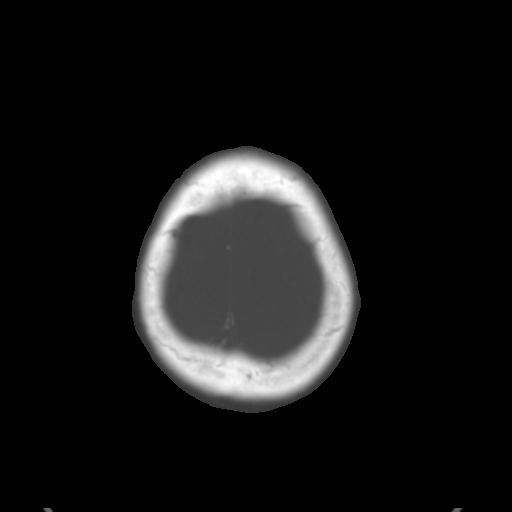
[im 32/35  brain]
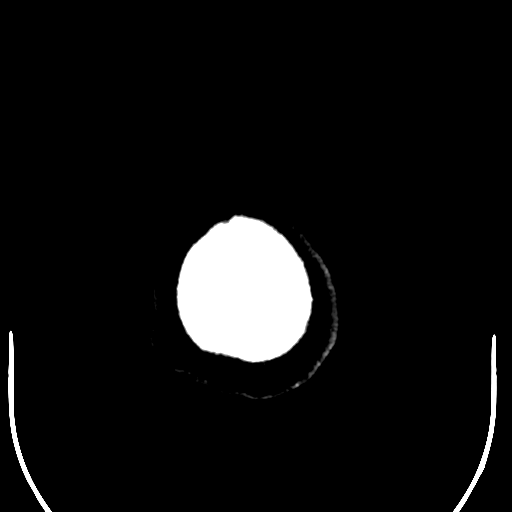

[Series 4: coronal soft tissue · coronal · 0.42mm/px · 3 of 82 slices shown]
[im 28/82  brain]
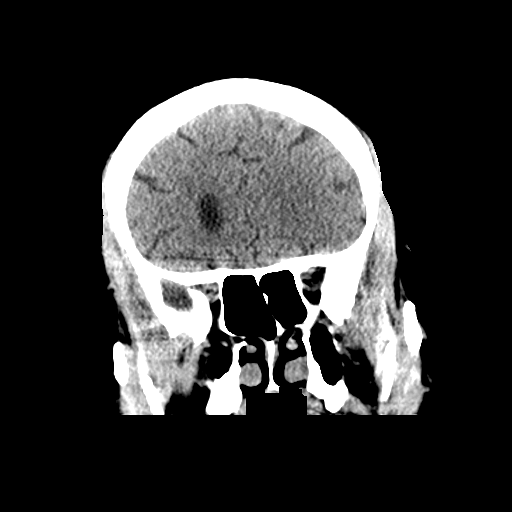
[im 37/82  brain]
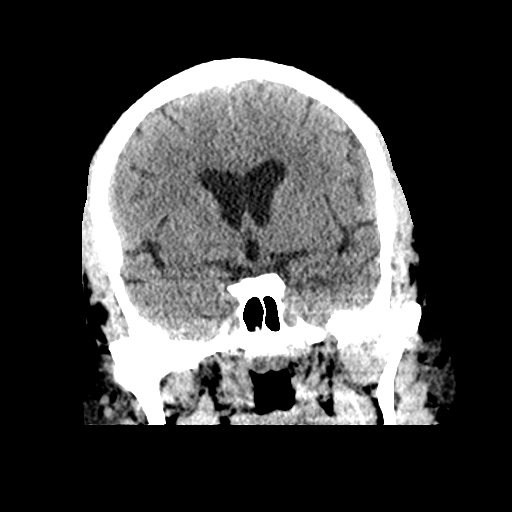
[im 46/82  brain]
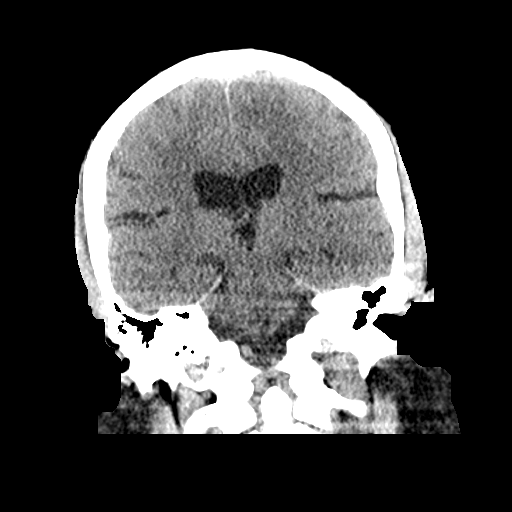

[Series 5: sagittal soft tissue · sagittal · 0.39mm/px · 3 of 84 slices shown]
[im 28/84  brain]
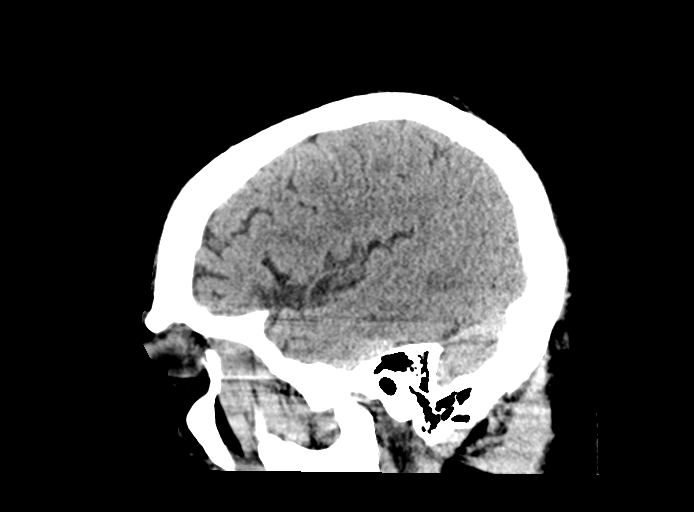
[im 42/84  brain]
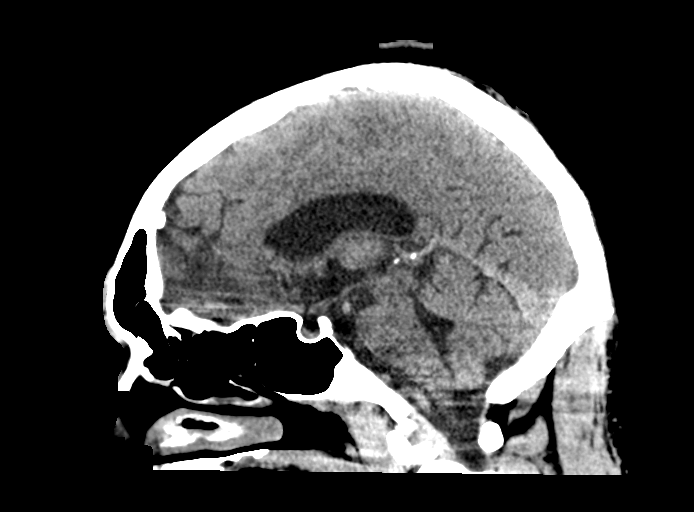
[im 56/84  brain]
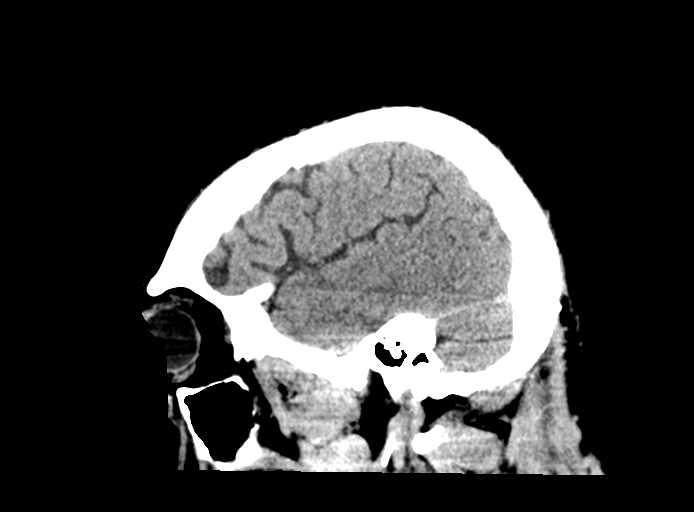

[16 of 47 positions shown; findings below may reference images not displayed]

FINDINGS: Brain: No evidence of acute infarction, hemorrhage, hydrocephalus,
extra-axial collection or mass lesion/mass effect. Mild cerebral
atrophy, slightly advanced for age.

Vascular: No hyperdense vessel or unexpected calcification.

Skull: Normal. Negative for fracture or focal lesion.

Sinuses/Orbits: No acute finding.

Other: None.
IMPRESSION: 1.  No acute intracranial abnormality.

## 2023-03-02 IMAGING — CR DG CHEST 2V
2 series · 2 of 2 positions shown · non-contrast
Comparison: None.

CLINICAL DATA: Syncope.

EXAM:
CHEST - 2 VIEW

[chest pa]
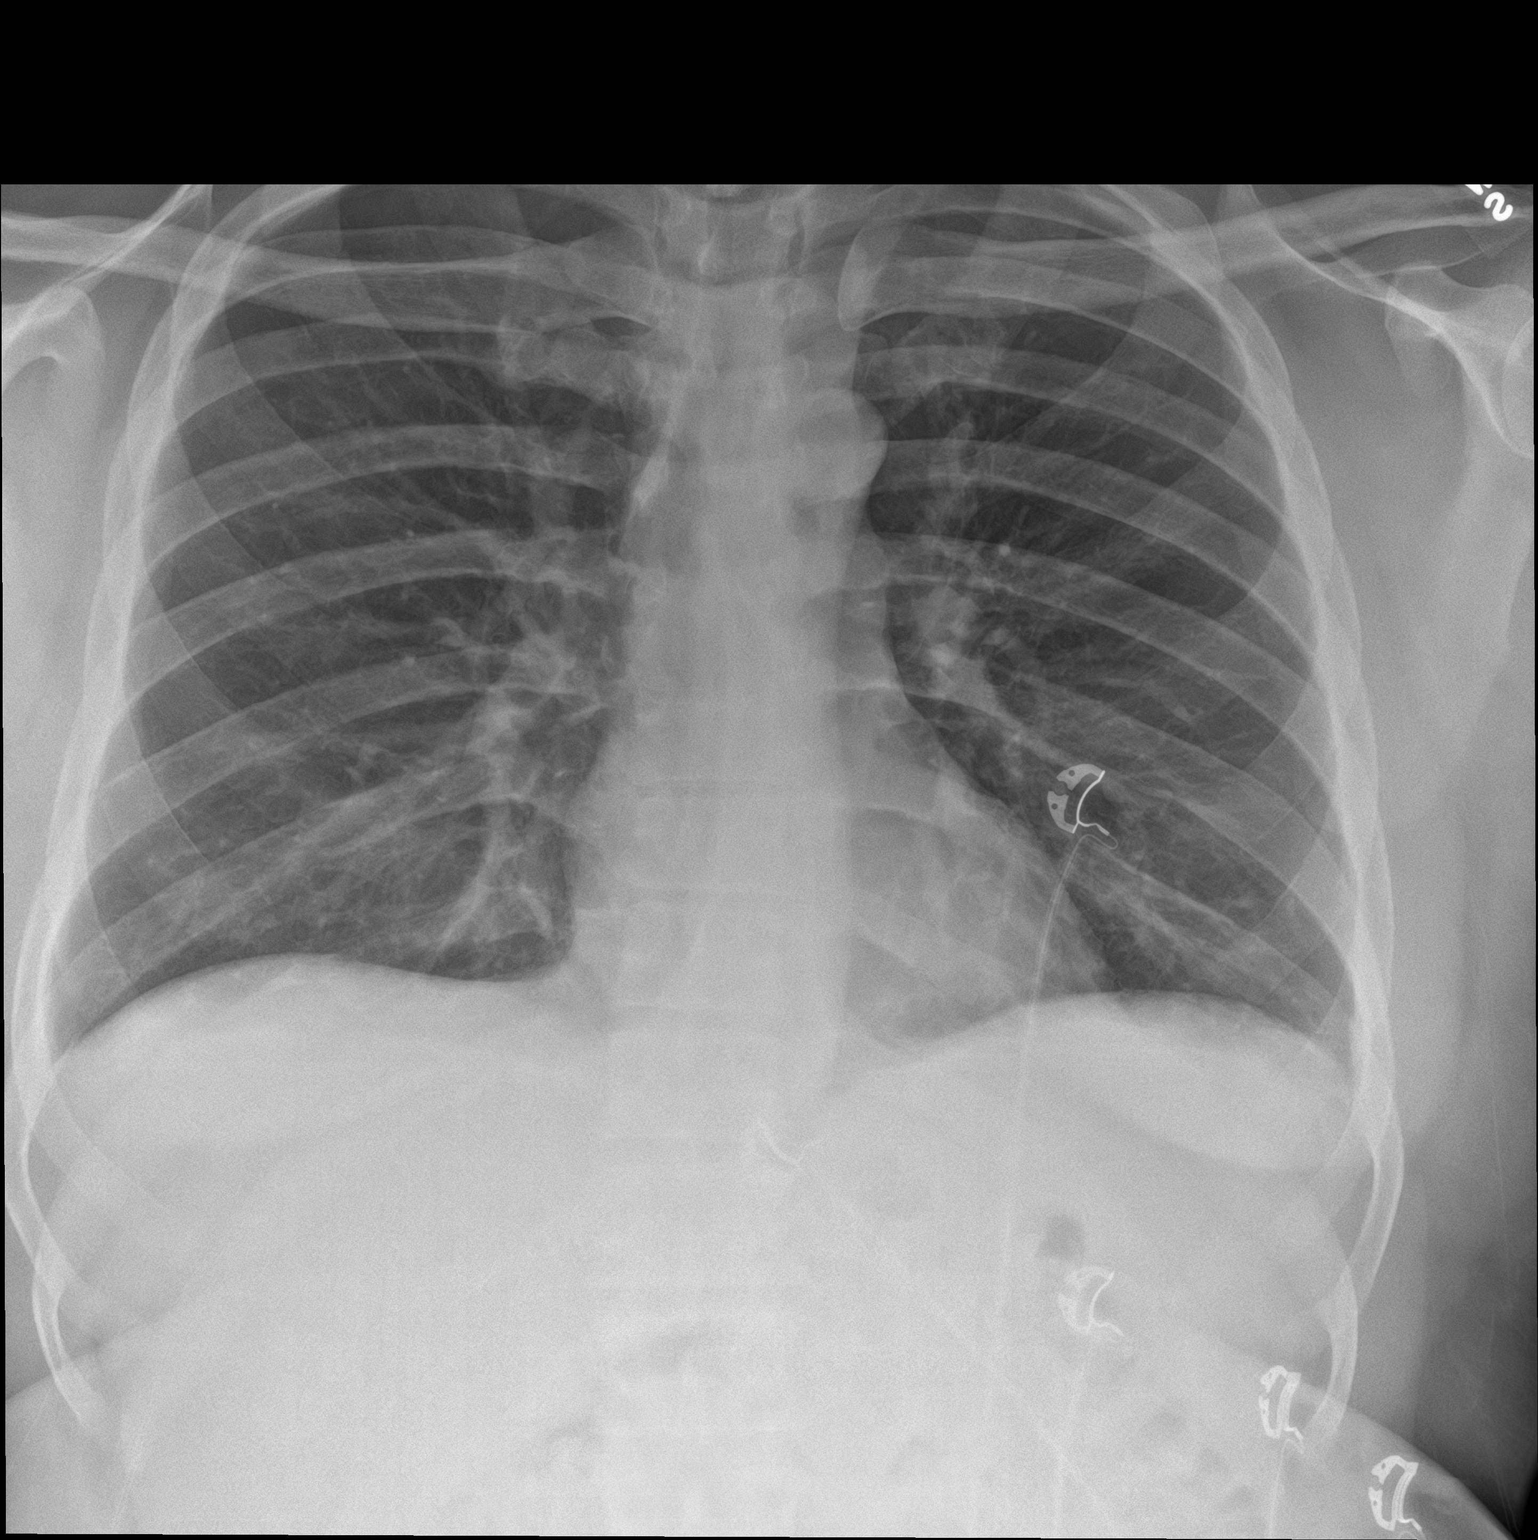

[chest lat]
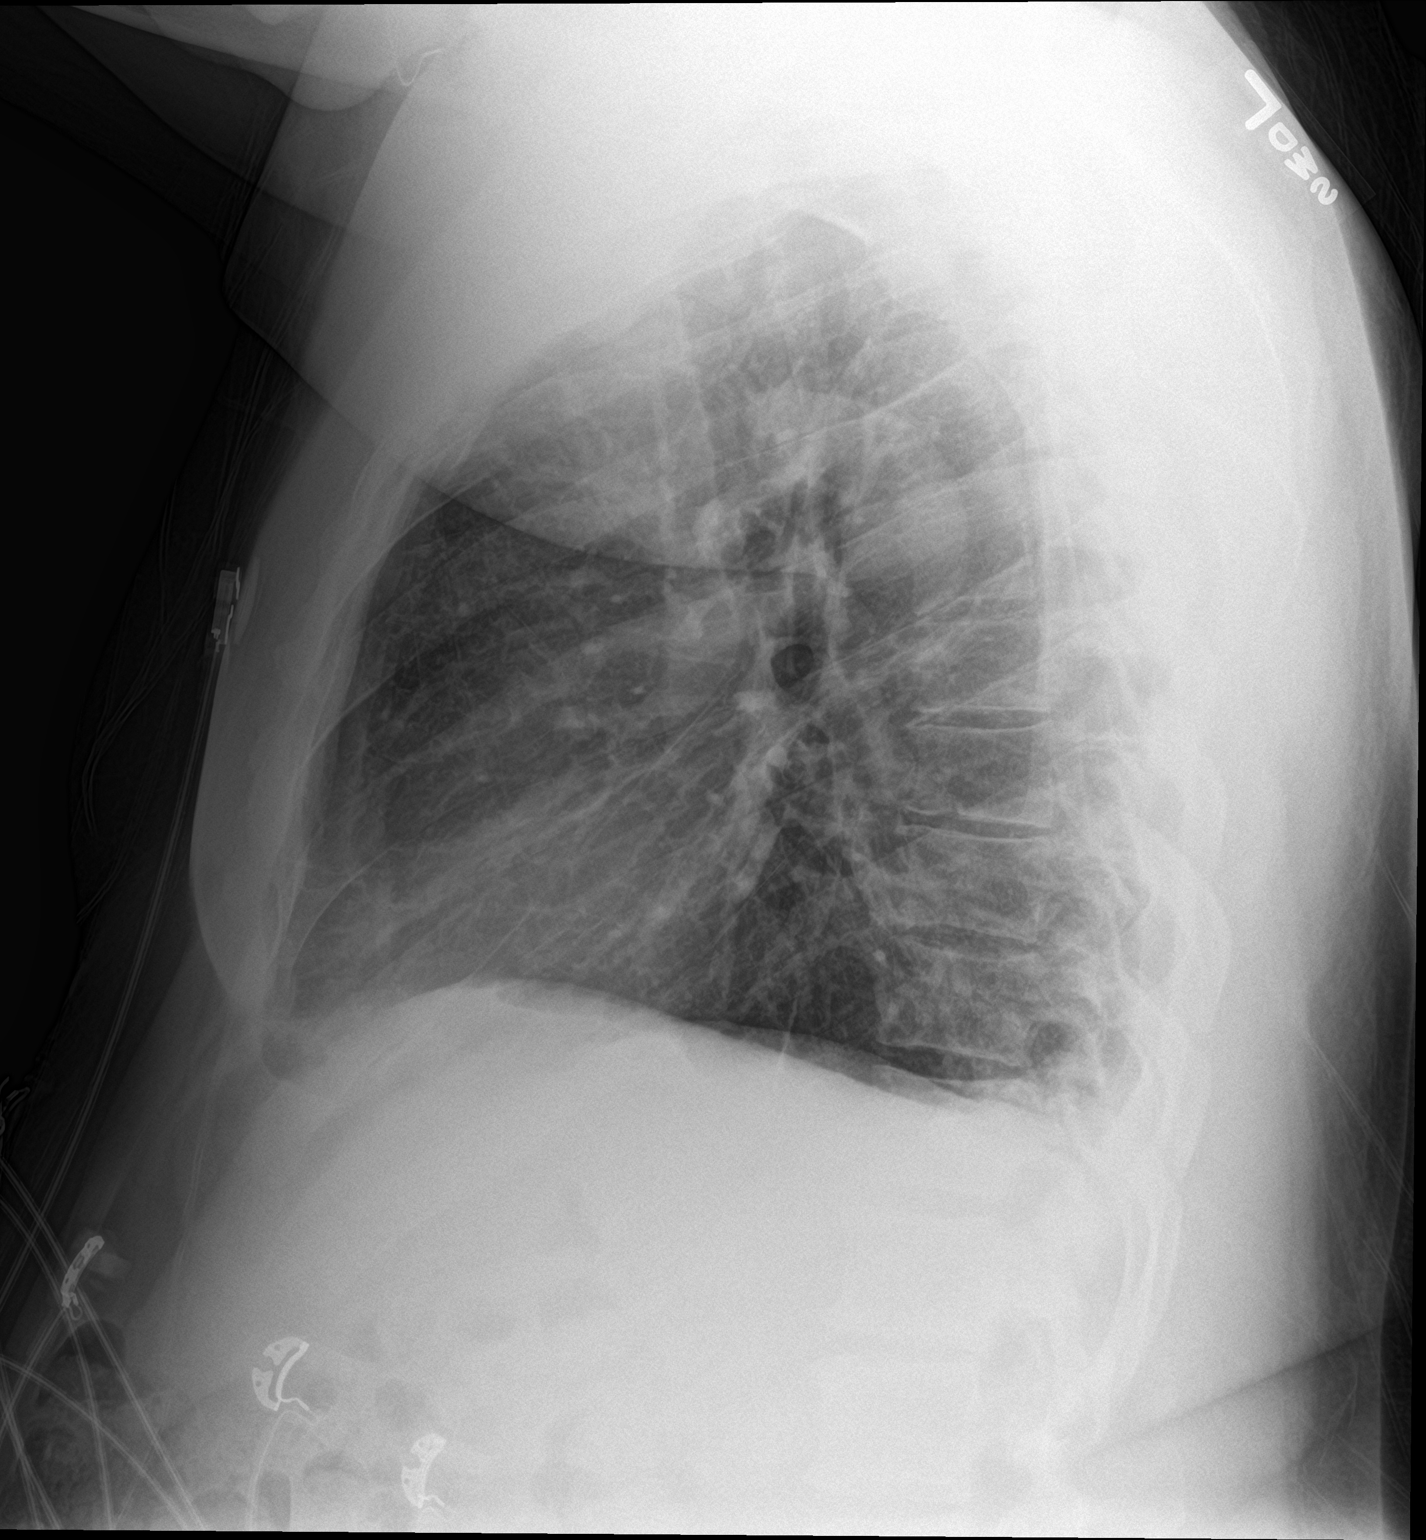

[2 of 2 positions shown; findings below may reference images not displayed]

FINDINGS: The heart size and mediastinal contours are within normal limits.
Both lungs are clear. The visualized skeletal structures are
unremarkable.
IMPRESSION: No active cardiopulmonary disease.

## 2023-03-06 ENCOUNTER — Encounter: Payer: Self-pay | Admitting: Gastroenterology

## 2023-03-07 ENCOUNTER — Encounter: Payer: Self-pay | Admitting: Gastroenterology

## 2023-03-07 ENCOUNTER — Ambulatory Visit
Admission: RE | Admit: 2023-03-07 | Discharge: 2023-03-07 | Disposition: A | Discharge status: Home / Self Care | Payer: Medicare Other | Source: Ambulatory Visit | Attending: Gastroenterology | Admitting: Gastroenterology

## 2023-03-07 ENCOUNTER — Other Ambulatory Visit: Payer: Self-pay

## 2023-03-07 ENCOUNTER — Ambulatory Visit: Payer: Medicare Other | Admitting: Certified Registered Nurse Anesthetist

## 2023-03-07 ENCOUNTER — Encounter
Admission: RE | Disposition: A | Discharge status: Home / Self Care | Payer: Self-pay | Source: Ambulatory Visit | Attending: Gastroenterology

## 2023-03-07 DIAGNOSIS — K2100 Gastro-esophageal reflux disease with esophagitis, without bleeding: Secondary | ICD-10-CM | POA: Insufficient documentation

## 2023-03-07 DIAGNOSIS — Z7984 Long term (current) use of oral hypoglycemic drugs: Secondary | ICD-10-CM | POA: Insufficient documentation

## 2023-03-07 DIAGNOSIS — K2900 Acute gastritis without bleeding: Secondary | ICD-10-CM | POA: Insufficient documentation

## 2023-03-07 DIAGNOSIS — F259 Schizoaffective disorder, unspecified: Secondary | ICD-10-CM | POA: Insufficient documentation

## 2023-03-07 DIAGNOSIS — K317 Polyp of stomach and duodenum: Secondary | ICD-10-CM | POA: Diagnosis not present

## 2023-03-07 DIAGNOSIS — F319 Bipolar disorder, unspecified: Secondary | ICD-10-CM | POA: Insufficient documentation

## 2023-03-07 DIAGNOSIS — K920 Hematemesis: Secondary | ICD-10-CM | POA: Insufficient documentation

## 2023-03-07 DIAGNOSIS — F419 Anxiety disorder, unspecified: Secondary | ICD-10-CM | POA: Insufficient documentation

## 2023-03-07 DIAGNOSIS — I129 Hypertensive chronic kidney disease with stage 1 through stage 4 chronic kidney disease, or unspecified chronic kidney disease: Secondary | ICD-10-CM | POA: Insufficient documentation

## 2023-03-07 DIAGNOSIS — N189 Chronic kidney disease, unspecified: Secondary | ICD-10-CM | POA: Insufficient documentation

## 2023-03-07 DIAGNOSIS — K449 Diaphragmatic hernia without obstruction or gangrene: Secondary | ICD-10-CM | POA: Insufficient documentation

## 2023-03-07 HISTORY — DX: Chronic kidney disease, unspecified: N18.9

## 2023-03-07 HISTORY — DX: Essential (primary) hypertension: I10

## 2023-03-07 HISTORY — DX: Type 2 diabetes mellitus without complications: E11.9

## 2023-03-07 HISTORY — PX: ESOPHAGOGASTRODUODENOSCOPY (EGD) WITH PROPOFOL: SHX5813

## 2023-03-07 SURGERY — ESOPHAGOGASTRODUODENOSCOPY (EGD) WITH PROPOFOL
Anesthesia: General

## 2023-03-07 MED ORDER — LIDOCAINE HCL (PF) 2 % IJ SOLN
INTRAMUSCULAR | Status: AC
  Filled 2023-03-07: qty 50

## 2023-03-07 MED ORDER — PROPOFOL 10 MG/ML IV BOLUS
INTRAVENOUS | Status: DC | PRN
  Administered 2023-03-07: 100 mg via INTRAVENOUS
  Administered 2023-03-07: 30 mg via INTRAVENOUS

## 2023-03-07 MED ORDER — PHENYLEPHRINE 80 MCG/ML (10ML) SYRINGE FOR IV PUSH (FOR BLOOD PRESSURE SUPPORT)
PREFILLED_SYRINGE | INTRAVENOUS | Status: AC
  Filled 2023-03-07: qty 40

## 2023-03-07 MED ORDER — GLYCOPYRROLATE 0.2 MG/ML IJ SOLN
INTRAMUSCULAR | Status: DC | PRN
  Administered 2023-03-07: .2 mg via INTRAVENOUS

## 2023-03-07 MED ORDER — PROPOFOL 500 MG/50ML IV EMUL
INTRAVENOUS | Status: DC | PRN
  Administered 2023-03-07: 150 ug/kg/min via INTRAVENOUS

## 2023-03-07 MED ORDER — EPHEDRINE 5 MG/ML INJ
INTRAVENOUS | Status: AC
  Filled 2023-03-07: qty 10

## 2023-03-07 MED ORDER — DEXMEDETOMIDINE HCL IN NACL 80 MCG/20ML IV SOLN
INTRAVENOUS | Status: DC | PRN
  Administered 2023-03-07: 4 ug via INTRAVENOUS

## 2023-03-07 MED ORDER — LIDOCAINE HCL (CARDIAC) PF 100 MG/5ML IV SOSY
PREFILLED_SYRINGE | INTRAVENOUS | Status: DC | PRN
  Administered 2023-03-07: 100 mg via INTRAVENOUS

## 2023-03-07 MED ORDER — PROPOFOL 1000 MG/100ML IV EMUL
INTRAVENOUS | Status: AC
  Filled 2023-03-07: qty 100

## 2023-03-07 MED ORDER — SODIUM CHLORIDE 0.9 % IV SOLN: INTRAVENOUS | Status: DC

## 2023-03-07 NOTE — Anesthesia Preprocedure Evaluation (Signed)
Anesthesia Evaluation  Patient identified by MRN, date of birth, ID band Patient awake    Reviewed: Allergy & Precautions, NPO status , Patient's Chart, lab work & pertinent test results  History of Anesthesia Complications Negative for: history of anesthetic complications  Airway Mallampati: II  TM Distance: >3 FB Neck ROM: Full    Dental  (+) Missing, Poor Dentition   Pulmonary neg pulmonary ROS, neg sleep apnea, neg COPD, Patient abstained from smoking.Not current smoker   Pulmonary exam normal breath sounds clear to auscultation       Cardiovascular Exercise Tolerance: Good METShypertension, (-) CAD and (-) Past MI (-) dysrhythmias  Rhythm:Regular Rate:Normal - Systolic murmurs    Neuro/Psych  PSYCHIATRIC DISORDERS Anxiety Depression Bipolar Disorder Schizophrenia  negative neurological ROS     GI/Hepatic ,GERD  ,,(+)     (-) substance abuse    Endo/Other  diabetes    Renal/GU CRFRenal disease     Musculoskeletal   Abdominal   Peds  Hematology  (+) REFUSES BLOOD PRODUCTSConfirmed with patient that he refuses blood transfusion even if the refusal meant his death   Anesthesia Other Findings Past Medical History: No date: Anxiety No date: Bipolar 1 disorder (HCC) No date: Depression No date: GERD (gastroesophageal reflux disease) No date: Schizoaffective disorder (HCC) No date: TBI (traumatic brain injury) (HCC)  Reproductive/Obstetrics                              Anesthesia Physical Anesthesia Plan  ASA: 3  Anesthesia Plan: General   Post-op Pain Management: Minimal or no pain anticipated   Induction: Intravenous  PONV Risk Score and Plan: 2 and Propofol infusion, TIVA and Ondansetron  Airway Management Planned: Nasal Cannula  Additional Equipment: None  Intra-op Plan:   Post-operative Plan:   Informed Consent: I have reviewed the patients History and Physical,  chart, labs and discussed the procedure including the risks, benefits and alternatives for the proposed anesthesia with the patient or authorized representative who has indicated his/her understanding and acceptance.     Dental advisory given  Plan Discussed with: CRNA and Surgeon  Anesthesia Plan Comments: (Discussed risks of anesthesia with patient, including possibility of difficulty with spontaneous ventilation under anesthesia necessitating airway intervention, PONV, and rare risks such as cardiac or respiratory or neurological events, and allergic reactions. Discussed the role of CRNA in patient's perioperative care. Patient understands.)        Anesthesia Quick Evaluation

## 2023-03-07 NOTE — H&P (Signed)
Pre-Procedure H&P   Patient ID: Jonathan Cabrera is a 52 y.o. male.  Gastroenterology Provider: [name], DO  Referring Provider: Tawni Pummel, PA PCP: Franciso Bend, NP  Date: 03/07/2023  HPI Mr. Jonathan Cabrera is a 52 y.o. male who presents today for Esophagogastroduodenoscopy for GERD, gastritis follow-up .  Patient presented to the hospital in April 2024 with hematemesis.  He underwent EGD demonstrating gastric polyp with gastritis.  LA grade a esophagitis and a 4 cm hiatal hernia.  Some gastropathy was noted in the body.  Biopsies were negative for H. pylori and negative for gastric intestinal metaplasia.  The polyp was not hyperplastic.  There was a question of bile reflux leading to the gastropathy  Since then hemoglobin is improved at 13.7 MCV 86.7 platelets 209,000 creatinine 1.3   Past Medical History:  Diagnosis Date   Anxiety    Bipolar 1 disorder (HCC)    Chronic kidney disease    Depression    GERD (gastroesophageal reflux disease)    Hypertension    Schizoaffective disorder (HCC)    TBI (traumatic brain injury) (HCC)     Past Surgical History:  Procedure Laterality Date   ESOPHAGOGASTRODUODENOSCOPY (EGD) WITH PROPOFOL N/A 08/12/2022   Procedure: ESOPHAGOGASTRODUODENOSCOPY (EGD) WITH PROPOFOL;  Surgeon: [name], DO;  Location: [facility] ENDOSCOPY;  Service: Gastroenterology;  Laterality: N/A;    Family History No h/o GI disease or malignancy  Review of Systems  Constitutional:  Negative for activity change, appetite change, chills, diaphoresis, fatigue, fever and unexpected weight change.  HENT:  Negative for trouble swallowing and voice change.   Respiratory:  Negative for shortness of breath and wheezing.   Cardiovascular:  Negative for chest pain, palpitations and leg swelling.  Gastrointestinal:  Negative for abdominal distention, abdominal pain, anal bleeding, blood in stool, constipation, diarrhea, nausea and vomiting.   Musculoskeletal:  Negative for arthralgias and myalgias.  Skin:  Negative for color change and pallor.  Neurological:  Negative for dizziness, syncope and weakness.  Psychiatric/Behavioral:  Negative for confusion. The patient is not nervous/anxious.   All other systems reviewed and are negative.    Medications No current facility-administered medications on file prior to encounter.   Current Outpatient Medications on File Prior to Encounter  Medication Sig Dispense Refill   benztropine (COGENTIN) 1 MG tablet Take 1 mg by mouth 2 (two) times daily.     Cholecalciferol (VITAMIN D-3) 125 MCG (5000 UT) TABS Take 5,000 Units by mouth in the morning.     dapagliflozin propanediol (FARXIGA) 5 MG TABS tablet Take 5 mg by mouth every morning.     ferrous sulfate 325 (65 FE) MG tablet Take 325 mg by mouth in the morning.     fluPHENAZine (PROLIXIN) 5 MG tablet Take 15 mg by mouth at bedtime.      hydrochlorothiazide (HYDRODIURIL) 25 MG tablet Take 25 mg by mouth in the morning.     hydrOXYzine (ATARAX/VISTARIL) 25 MG tablet Take 25 mg by mouth every evening.     lithium carbonate (LITHOBID) 300 MG CR tablet Take 600 mg by mouth at bedtime.     lithium carbonate (LITHOBID) 300 MG ER tablet Take 300 mg by mouth in the morning.     Melatonin 3 MG TABS Take 6 mg by mouth at bedtime.     QUEtiapine (SEROQUEL) 100 MG tablet Take 100 mg by mouth in the morning.     QUEtiapine (SEROQUEL) 100 MG tablet Take 100 mg by  mouth at bedtime. (Take with 300mg  tablet to equal 400mg  total)     QUEtiapine (SEROQUEL) 300 MG tablet Take 300 mg by mouth at bedtime. (Take with 100mg  tablet to equal 400mg  total)     rosuvastatin (CRESTOR) 20 MG tablet Take 20 mg by mouth at bedtime.     telmisartan (MICARDIS) 20 MG tablet Take 20 mg by mouth in the morning.     amLODipine (NORVASC) 10 MG tablet TAKE 1 TABLET BY MOUTH ONCE DAILY (Patient taking differently: Take 10 mg by mouth in the morning.) 90 tablet 1   fluPHENAZine  (PROLIXIN) 5 MG tablet Take 10 mg by mouth in the morning.     omeprazole (PRILOSEC) 40 MG capsule Take 1 capsule (40 mg total) by mouth 2 (two) times daily. 30 capsule 1   polyethylene glycol powder (GLYCOLAX/MIRALAX) 17 GM/SCOOP powder Take 8.5 g by mouth every Monday, Wednesday, and Friday. 3350 g 1    Pertinent medications related to GI and procedure were reviewed by me with the patient prior to the procedure   Current Facility-Administered Medications:    0.9 %  sodium chloride infusion, , Intravenous, Continuous, [name], DO, Last Rate: 20 mL/hr at 03/07/23 0844, New Bag at 03/07/23 0844  sodium chloride 20 mL/hr at 03/07/23 8119       Allergies  Allergen Reactions   Haldol [Haloperidol] Nausea And Vomiting   Erythromycin Rash   Penicillins Rash   Allergies were reviewed by me prior to the procedure  Objective   Body mass index is 32.82 kg/m. Vitals:   03/07/23 0842  BP: 123/87  Pulse: 76  Resp: 16  Temp: (!) 97.4 F (36.3 C)  TempSrc: Temporal  SpO2: 99%  Weight: 109.8 kg  Height: 6' (1.829 m)     Physical Exam Vitals and nursing note reviewed.  Constitutional:      General: He is not in acute distress.    Appearance: Normal appearance. He is not ill-appearing, toxic-appearing or diaphoretic.  HENT:     Head: Normocephalic and atraumatic.     Nose: Nose normal.     Mouth/Throat:     Mouth: Mucous membranes are moist.     Pharynx: Oropharynx is clear.  Eyes:     General: No scleral icterus.    Extraocular Movements: Extraocular movements intact.  Cardiovascular:     Rate and Rhythm: Normal rate and regular rhythm.     Heart sounds: Normal heart sounds. No murmur heard.    No friction rub. No gallop.  Pulmonary:     Effort: Pulmonary effort is normal. No respiratory distress.     Breath sounds: Normal breath sounds. No wheezing, rhonchi or rales.  Abdominal:     General: Abdomen is flat. Bowel sounds are normal. There is no distension.      Palpations: Abdomen is soft.     Tenderness: There is no abdominal tenderness. There is no guarding or rebound.  Musculoskeletal:     Cervical back: Neck supple.     Right lower leg: No edema.     Left lower leg: No edema.  Skin:    General: Skin is warm and dry.     Coloration: Skin is not jaundiced or pale.  Neurological:     General: No focal deficit present.     Mental Status: He is alert and oriented to person, place, and time. Mental status is at baseline.  Psychiatric:        Mood and Affect: Mood normal.  Behavior: Behavior normal.        Thought Content: Thought content normal.        Judgment: Judgment normal.      Assessment:  Mr. Jonathan Cabrera is a 51 y.o. male  who presents today for Esophagogastroduodenoscopy for GERD, gastritis follow-up .  Plan:  Esophagogastroduodenoscopy with possible intervention today  Esophagogastroduodenoscopy with possible biopsy, control of bleeding, polypectomy, and interventions as necessary has been discussed with the patient/patient representative. Informed consent was obtained from the patient/patient representative after explaining the indication, nature, and risks of the procedure including but not limited to death, bleeding, perforation, missed neoplasm/lesions, cardiorespiratory compromise, and reaction to medications. Opportunity for questions was given and appropriate answers were provided. Patient/patient representative has verbalized understanding is amenable to undergoing the procedure.   [name], DO  [facility]  Portions of the record may have been created with voice recognition software. Occasional wrong-word or 'sound-a-like' substitutions may have occurred due to the inherent limitations of voice recognition software.  Read the chart carefully and recognize, using context, where substitutions may have occurred.

## 2023-03-07 NOTE — Interval H&P Note (Signed)
History and Physical Interval Note: Preprocedure H&P from 03/07/23  was reviewed and there was no interval change after seeing and examining the patient.  Written consent was obtained from the patient after discussion of risks, benefits, and alternatives. Patient has consented to proceed with Esophagogastroduodenoscopy with possible intervention   03/07/2023 9:19 AM  Jonathan Cabrera  has presented today for surgery, with the diagnosis of 530.81 (ICD-9-CM) - K21.9 (ICD-10-CM) - Gastroesophageal reflux disease, unspecified whether esophagitis present.  The various methods of treatment have been discussed with the patient and family. After consideration of risks, benefits and other options for treatment, the patient has consented to  Procedure(s): ESOPHAGOGASTRODUODENOSCOPY (EGD) WITH PROPOFOL (N/A) as a surgical intervention.  The patient's history has been reviewed, patient examined, no change in status, stable for surgery.  I have reviewed the patient's chart and labs.  Questions were answered to the patient's satisfaction.     [name]

## 2023-03-07 NOTE — Transfer of Care (Signed)
Immediate Anesthesia Transfer of Care Note  Patient: Jonathan Cabrera  Procedure(s) Performed: ESOPHAGOGASTRODUODENOSCOPY (EGD) WITH PROPOFOL  Patient Location: PACU  Anesthesia Type:General  Level of Consciousness: drowsy  Airway & Oxygen Therapy: Patient Spontanous Breathing  Post-op Assessment: Report given to RN and Post -op Vital signs reviewed and stable  Post vital signs: Reviewed and stable  Last Vitals:  Vitals Value Taken Time  BP 97/70 03/07/23 0939  Temp    Pulse 77 03/07/23 0939  Resp 12   SpO2 98 % 03/07/23 0939  Vitals shown include unfiled device data.  Last Pain:  Vitals:   03/07/23 0842  TempSrc: Temporal  PainSc: 0-No pain         Complications: No notable events documented.

## 2023-03-07 NOTE — Op Note (Signed)
[facility] Gastroenterology Patient Name: Jonathan Cabrera Procedure Date: 03/07/2023 9:10 AM MRN: [id number] Account #: [id number] Date of Birth: 20-Feb-1971 Admit Type: Outpatient Age: 52 Room: [facility] ENDO ROOM 1 Gender: Male Note Status: Finalized Instrument Name: Upper Endoscope [id number] Procedure:             Upper GI endoscopy Indications:           Follow-up of reflux esophagitis Providers:             [name] DO, DO Referring MD:          Lise Auer. Aundria Rud (Referring MD) Medicines:             Monitored Anesthesia Care Complications:         No immediate complications. Estimated blood loss:                         Minimal. Procedure:             Pre-Anesthesia Assessment:                        - Prior to the procedure, a History and Physical was                         performed, and patient medications and allergies were                         reviewed. The patient is competent. The risks and                         benefits of the procedure and the sedation options and                         risks were discussed with the patient. All questions                         were answered and informed consent was obtained.                         Patient identification and proposed procedure were                         verified by the physician, the nurse, the anesthetist                         and the technician in the endoscopy suite. Mental                         Status Examination: alert and oriented. Airway                         Examination: normal oropharyngeal airway and neck                         mobility. Respiratory Examination: clear to                         auscultation. CV Examination: RRR, no murmurs, no S3  or S4. Prophylactic Antibiotics: The patient does not                         require prophylactic antibiotics. Prior                         Anticoagulants: The patient has taken no anticoagulant                          or antiplatelet agents. ASA Grade Assessment: III - A                         patient with severe systemic disease. After reviewing                         the risks and benefits, the patient was deemed in                         satisfactory condition to undergo the procedure. The                         anesthesia plan was to use monitored anesthesia care                         (MAC). Immediately prior to administration of                         medications, the patient was re-assessed for adequacy                         to receive sedatives. The heart rate, respiratory                         rate, oxygen saturations, blood pressure, adequacy of                         pulmonary ventilation, and response to care were                         monitored throughout the procedure. The physical                         status of the patient was re-assessed after the                         procedure.                        After obtaining informed consent, the endoscope was                         passed under direct vision. Throughout the procedure,                         the patient's blood pressure, pulse, and oxygen                         saturations were monitored continuously. The Endoscope  was introduced through the mouth, and advanced to the                         second part of duodenum. The upper GI endoscopy was                         accomplished without difficulty. The patient tolerated                         the procedure well. Findings:      The ampulla, duodenal bulb, first portion of the duodenum and second       portion of the duodenum were normal. Estimated blood loss: none.      The Z-line was regular and was found 38 cm from the incisors. Estimated       blood loss: none.      Esophagogastric landmarks were identified: the gastroesophageal junction       was found at 38 cm from the incisors.      A 4 cm hiatal hernia  was present.      The exam of the esophagus was otherwise normal.      Multiple less than 5 mm sessile polyps with no bleeding and no stigmata       of recent bleeding were found on the greater curvature of the stomach       and in the gastric antrum. Biopsies were taken with a cold forceps for       histology. Estimated blood loss was minimal.      Localized mild inflammation characterized by congestion (edema) and       granularity was found on the greater curvature of the stomach. Biopsies       were taken with a cold forceps for histology. Estimated blood loss was       minimal.      The exam of the stomach was otherwise normal. Impression:            - Normal ampulla, duodenal bulb, first portion of the                         duodenum and second portion of the duodenum.                        - Z-line regular, 38 cm from the incisors.                        - Esophagogastric landmarks identified.                        - 4 cm hiatal hernia.                        - Multiple gastric polyps. Biopsied.                        - Gastritis. Biopsied. Recommendation:        - Patient has a contact number available for                         emergencies. The signs and symptoms of potential  delayed complications were discussed with the patient.                         Return to normal activities tomorrow. Written                         discharge instructions were provided to the patient.                        - Discharge patient to home.                        - Resume previous diet.                        - Continue present medications.                        - Await pathology results.                        - Return to referring physician as previously                         scheduled.                        - The findings and recommendations were discussed with                         the patient. Procedure Code(s):     --- Professional ---                         [phone], Esophagogastroduodenoscopy, flexible,                         transoral; with biopsy, single or multiple Diagnosis Code(s):     --- Professional ---                        K44.9, Diaphragmatic hernia without obstruction or                         gangrene                        K31.7, Polyp of stomach and duodenum                        K29.70, Gastritis, unspecified, without bleeding                        K21.00, Gastro-esophageal reflux disease with                         esophagitis, without bleeding CPT copyright 2022 [employer] Association. All rights reserved. The codes documented in this report are preliminary and upon coder review may  be revised to meet current compliance requirements. Attending Participation:      I personally performed the entire procedure. Elfredia Nevins, DO [name] DO, DO 03/07/2023 9:44:32 AM This report has been signed electronically. Number of Addenda: 0 Note Initiated On: 03/07/2023 9:10 AM  Estimated Blood Loss:  Estimated blood loss was minimal.      [facility]

## 2023-03-07 NOTE — Anesthesia Postprocedure Evaluation (Signed)
Anesthesia Post Note  Patient: Jonathan Cabrera  Procedure(s) Performed: ESOPHAGOGASTRODUODENOSCOPY (EGD) WITH PROPOFOL  Patient location during evaluation: Endoscopy Anesthesia Type: General Level of consciousness: awake and alert Pain management: pain level controlled Vital Signs Assessment: post-procedure vital signs reviewed and stable Respiratory status: spontaneous breathing, nonlabored ventilation, respiratory function stable and patient connected to nasal cannula oxygen Cardiovascular status: blood pressure returned to baseline and stable Postop Assessment: no apparent nausea or vomiting Anesthetic complications: no   No notable events documented.   Last Vitals:  Vitals:   03/07/23 0948 03/07/23 0959  BP: 104/70   Pulse:    Resp:    Temp:    SpO2:  99%    Last Pain:  Vitals:   03/07/23 0959  TempSrc:   PainSc: 0-No pain                 Louie Boston

## 2023-03-08 ENCOUNTER — Encounter: Payer: Self-pay | Admitting: Gastroenterology

## 2023-03-12 LAB — SURGICAL PATHOLOGY

## 2023-06-27 IMAGING — US US RENAL
1 series · 14 of 25 positions shown · non-contrast
Comparison: None.

CLINICAL DATA: Chronic kidney disease stage 3.

EXAM:
RENAL / URINARY TRACT ULTRASOUND COMPLETE

[Series 1: us renal · 14 of 57 slices shown]
[im 1/57]
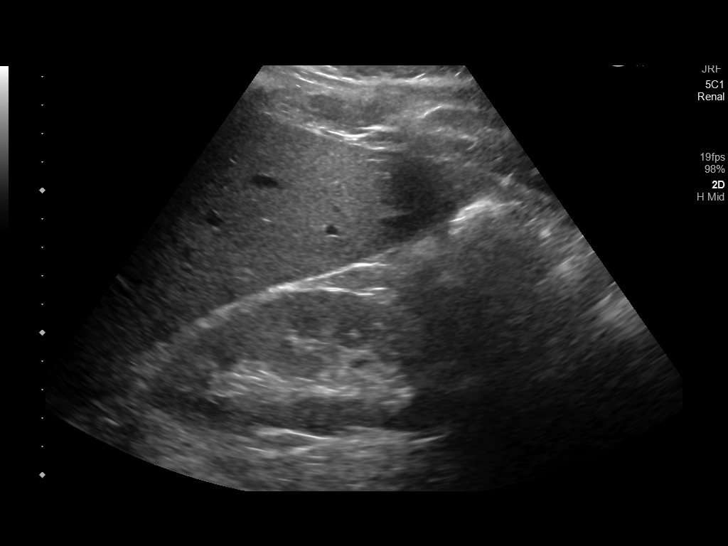
[im 5/57]
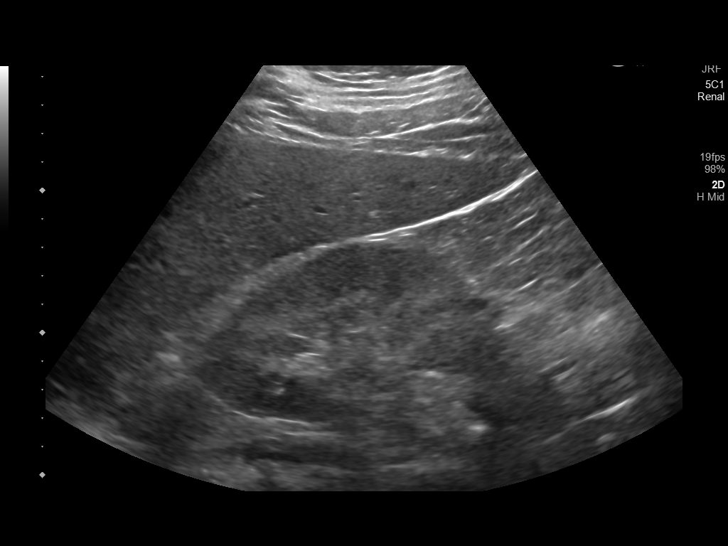
[im 10/57]
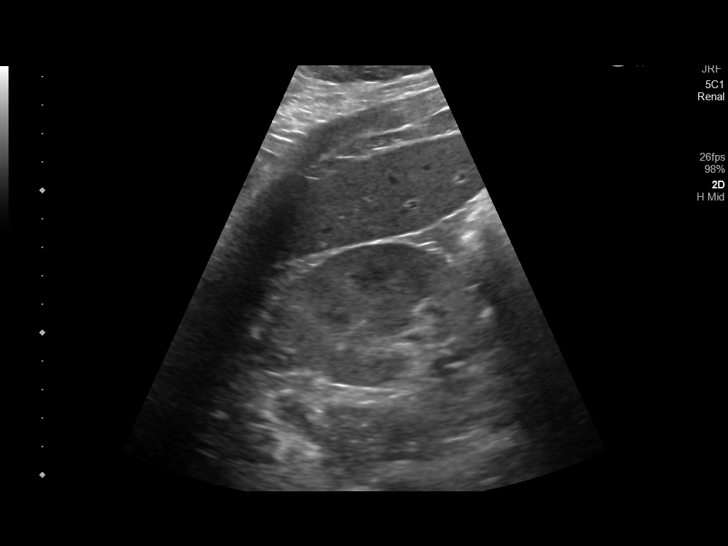
[im 15/57]
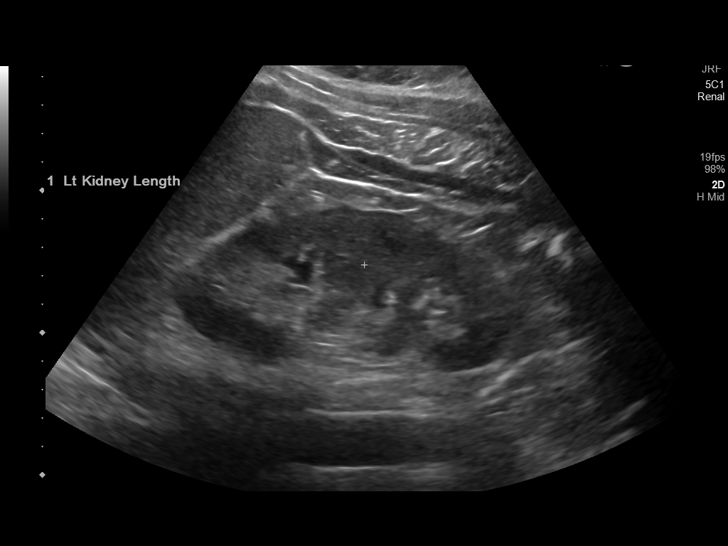
[im 19/57]
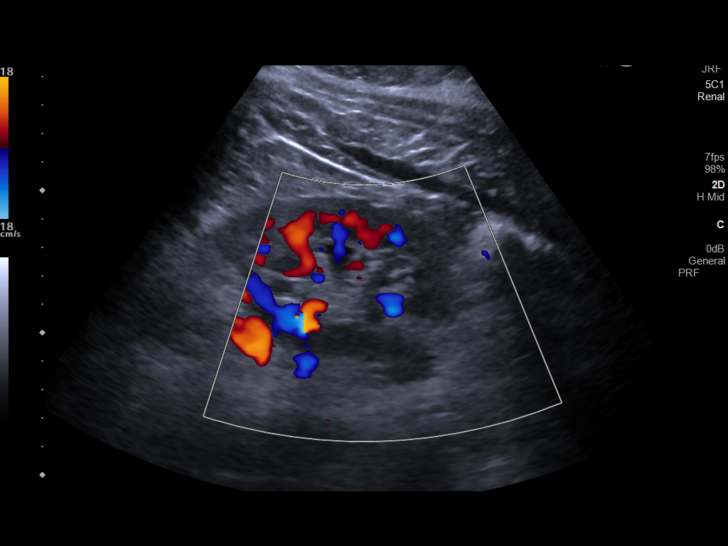
[im 22/57]
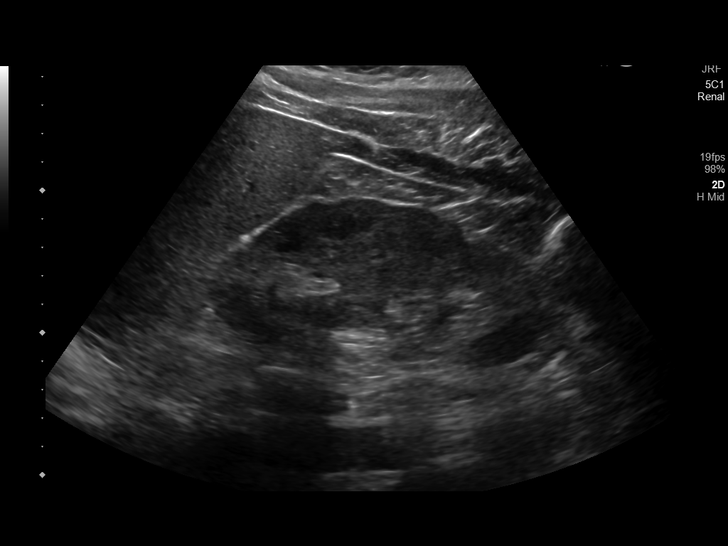
[im 26/57]
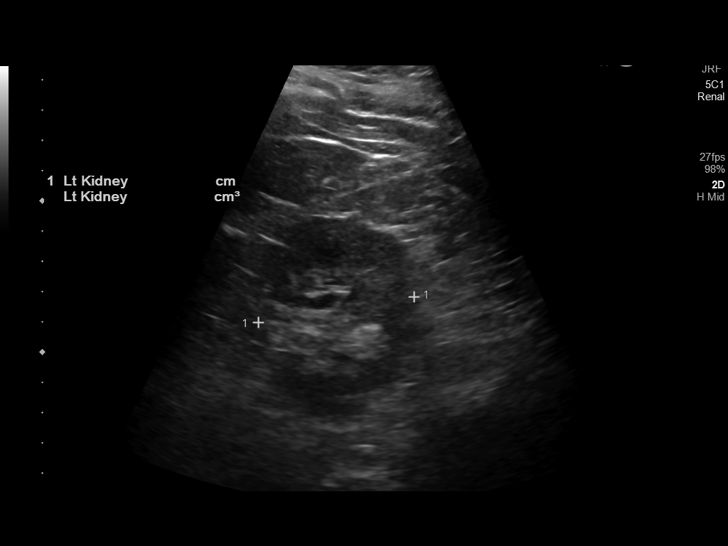
[im 31/57]
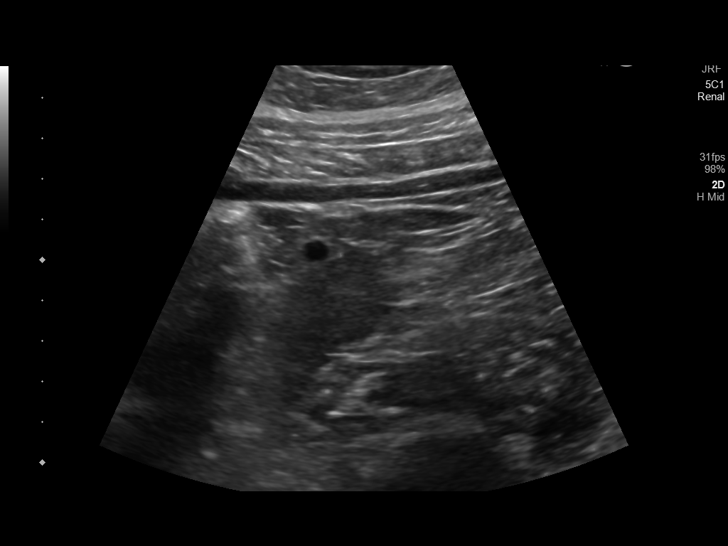
[im 36/57]
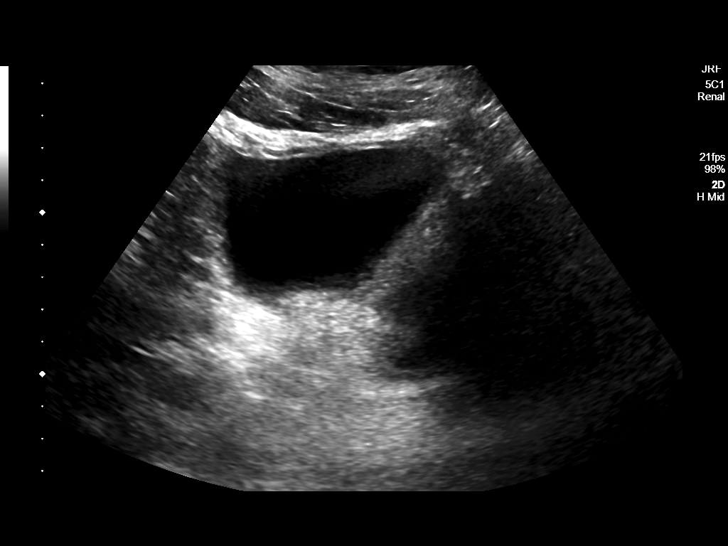
[im 38/57]
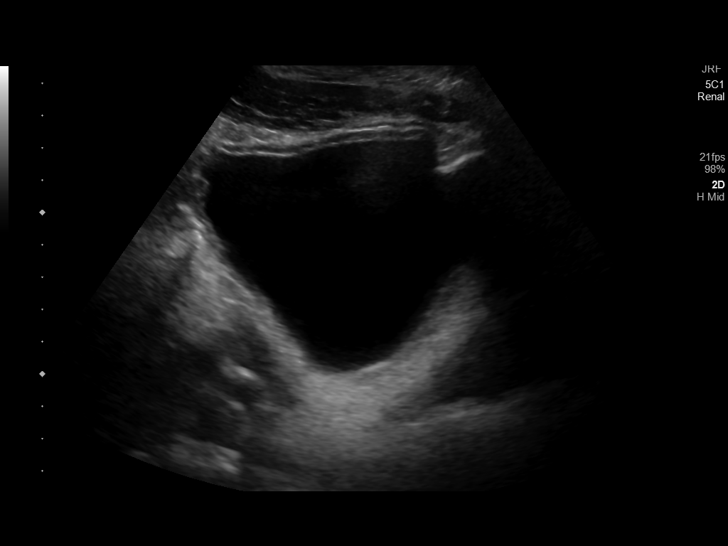
[im 43/57]
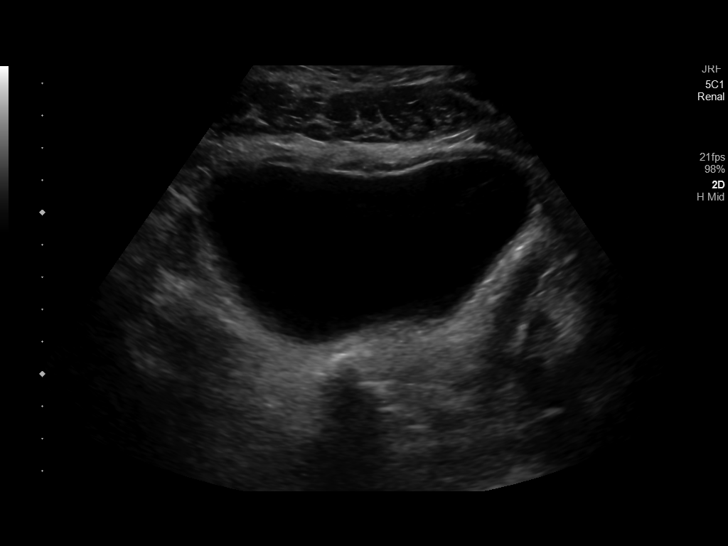
[im 47/57]
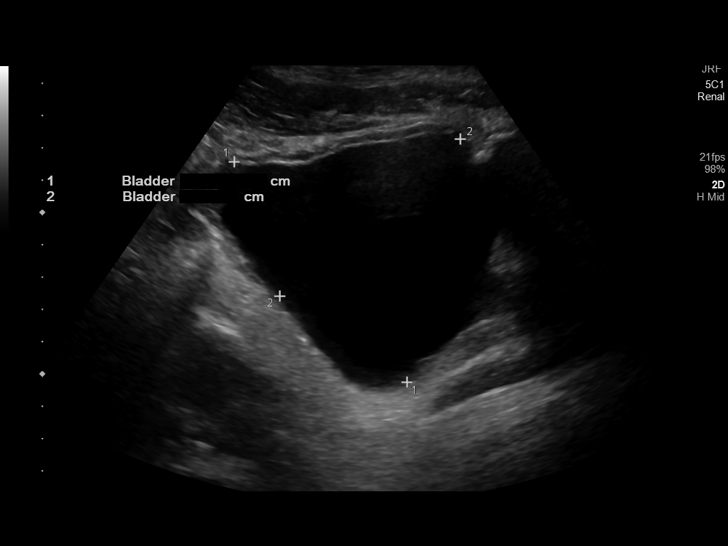
[im 52/57]
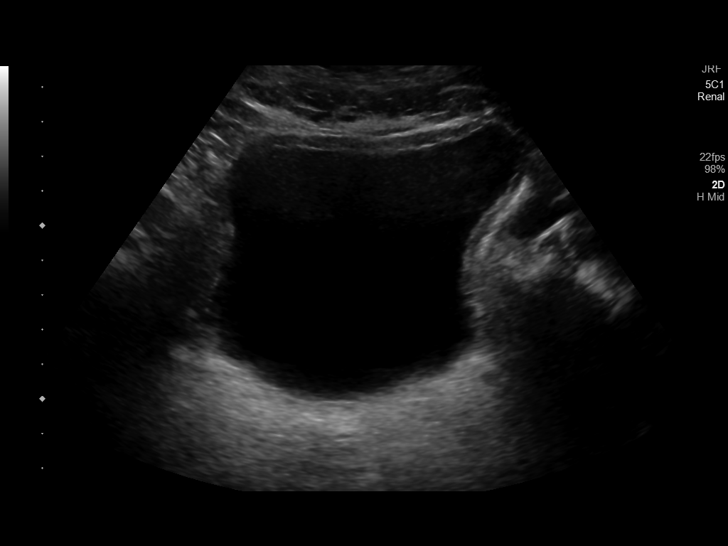
[im 57/57]
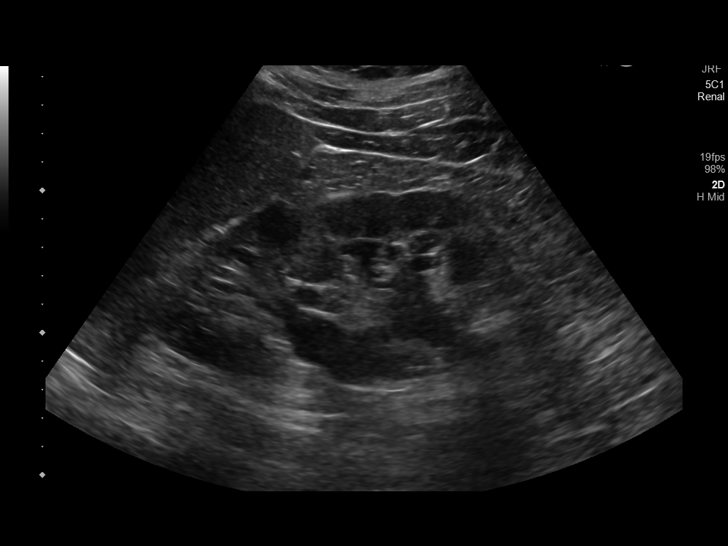

[14 of 25 positions shown; findings below may reference images not displayed]

FINDINGS: Right Kidney:

Renal measurements: 12.6 x 5.5 x 5.3 cm = volume: 192 mL. Mild
increased parenchymal echogenicity. No mass, stone or
hydronephrosis.

Left Kidney:

Renal measurements: 12.5 x 5.5 x 5.2 cm = volume: 189 mL. Mild
increased parenchymal echogenicity. Mild prominence of the renal
collecting system. Small cortical cyst from the lower pole, 7 mm. No
other masses. No stones.

Bladder:

Appears normal for degree of bladder distention. Bilateral ureteral
jets.

Other:

None.
IMPRESSION: 1. Mild prominence of the left intrarenal collecting system, which
may be physiologic. No definite hydronephrosis.
2. Mild increased renal parenchymal echogenicity consistent with
medical renal disease.
3. Small lower pole cyst from the left kidney. No other
abnormalities.

## 2023-10-10 LAB — COLOGUARD

## 2023-11-16 LAB — COLOGUARD: COLOGUARD: NEGATIVE

## 2023-12-08 ENCOUNTER — Other Ambulatory Visit: Payer: Self-pay

## 2023-12-08 ENCOUNTER — Emergency Department: Admission: EM | Admit: 2023-12-08 | Discharge: 2023-12-08 | Disposition: A | Discharge status: Home / Self Care

## 2023-12-08 DIAGNOSIS — D631 Anemia in chronic kidney disease: Secondary | ICD-10-CM | POA: Insufficient documentation

## 2023-12-08 DIAGNOSIS — R112 Nausea with vomiting, unspecified: Secondary | ICD-10-CM | POA: Diagnosis present

## 2023-12-08 DIAGNOSIS — K920 Hematemesis: Secondary | ICD-10-CM | POA: Diagnosis not present

## 2023-12-08 DIAGNOSIS — K449 Diaphragmatic hernia without obstruction or gangrene: Secondary | ICD-10-CM | POA: Insufficient documentation

## 2023-12-08 DIAGNOSIS — N189 Chronic kidney disease, unspecified: Secondary | ICD-10-CM | POA: Diagnosis not present

## 2023-12-08 LAB — COMPREHENSIVE METABOLIC PANEL WITH GFR
ALT: 18 U/L (ref 0–44)
AST: 28 U/L (ref 15–41)
Albumin: 4.1 g/dL (ref 3.5–5.0)
Alkaline Phosphatase: 86 U/L (ref 38–126)
Anion gap: 9 (ref 5–15)
BUN: 10 mg/dL (ref 6–20)
CO2: 24 mmol/L (ref 22–32)
Calcium: 9.7 mg/dL (ref 8.9–10.3)
Chloride: 106 mmol/L (ref 98–111)
Creatinine, Ser: 1.2 mg/dL (ref 0.61–1.24)
GFR, Estimated: >60 mL/min (ref >60)
Glucose, Bld: 117 mg/dL — ABNORMAL HIGH (ref 70–99)
Potassium: 3.5 mmol/L (ref 3.5–5.1)
Sodium: 139 mmol/L (ref 135–145)
Total Bilirubin: 1.1 mg/dL (ref 0.0–1.2)
Total Protein: 7.1 g/dL (ref 6.5–8.1)

## 2023-12-08 LAB — RESP PANEL BY RT-PCR (RSV, FLU A&B, COVID)  RVPGX2
Influenza A by PCR: NEGATIVE
Influenza B by PCR: NEGATIVE
Resp Syncytial Virus by PCR: NEGATIVE
SARS Coronavirus 2 by RT PCR: NEGATIVE

## 2023-12-08 LAB — CBC
HCT: 44 % (ref 39.0–52.0)
Hemoglobin: 14.3 g/dL (ref 13.0–17.0)
MCH: 26.6 pg (ref 26.0–34.0)
MCHC: 32.5 g/dL (ref 30.0–36.0)
MCV: 81.8 fL (ref 80.0–100.0)
Platelets: 262 K/uL (ref 150–400)
RBC: 5.38 MIL/uL (ref 4.22–5.81)
RDW: 13.7 % (ref 11.5–15.5)
WBC: 6.4 K/uL (ref 4.0–10.5)
nRBC: 0 % (ref 0.0–0.2)

## 2023-12-08 LAB — LIPASE, BLOOD: Lipase: 43 U/L (ref 11–51)

## 2023-12-08 LAB — HEMOGLOBIN AND HEMATOCRIT, BLOOD
HCT: 43.6 % (ref 39.0–52.0)
Hemoglobin: 14 g/dL (ref 13.0–17.0)

## 2023-12-08 MED ORDER — LIDOCAINE VISCOUS HCL 2 % MT SOLN
15.0000 mL | Freq: Once | OROMUCOSAL | Status: AC
  Administered 2023-12-08: 15 mL via ORAL
  Filled 2023-12-08: qty 15

## 2023-12-08 MED ORDER — ALUMINUM-MAGNESIUM-SIMETHICONE 200-200-20 MG/5ML PO SUSP: 30.0000 mL | Freq: Three times a day (TID) | ORAL | 0 refills | Status: AC

## 2023-12-08 MED ORDER — ONDANSETRON HCL 4 MG/2ML IJ SOLN
4.0000 mg | Freq: Once | INTRAMUSCULAR | Status: AC
  Administered 2023-12-08: 4 mg via INTRAVENOUS
  Filled 2023-12-08: qty 2

## 2023-12-08 MED ORDER — ONDANSETRON 4 MG PO TBDP
4.0000 mg | ORAL_TABLET | Freq: Three times a day (TID) | ORAL | 0 refills | Status: AC | PRN
Start: 2023-12-08 — End: ?

## 2023-12-08 MED ORDER — PANTOPRAZOLE SODIUM 20 MG PO TBEC: 20.0000 mg | DELAYED_RELEASE_TABLET | Freq: Every day | ORAL | 0 refills | Status: AC

## 2023-12-08 MED ORDER — SODIUM CHLORIDE 0.9 % IV BOLUS
1000.0000 mL | Freq: Once | INTRAVENOUS | Status: AC
  Administered 2023-12-08: 1000 mL via INTRAVENOUS

## 2023-12-08 MED ORDER — ALUM & MAG HYDROXIDE-SIMETH 200-200-20 MG/5ML PO SUSP
30.0000 mL | Freq: Once | ORAL | Status: AC
  Administered 2023-12-08: 30 mL via ORAL
  Filled 2023-12-08: qty 30

## 2023-12-08 MED ORDER — PANTOPRAZOLE SODIUM 40 MG IV SOLR
40.0000 mg | Freq: Once | INTRAVENOUS | Status: AC
  Administered 2023-12-08: 40 mg via INTRAVENOUS
  Filled 2023-12-08: qty 10

## 2023-12-08 NOTE — Discharge Instructions (Addendum)
 Your evaluation in the emergency department was overall reassuring.  I suspect you have some irritation of the lining of your stomach, and I have restarted you on an antacid medication as well as a nausea medication.  Please follow-up with your primary care provider and gastroenterologist for reevaluation, and return to the emergency department with any new or worsening symptoms.

## 2023-12-08 NOTE — ED Triage Notes (Signed)
 Pt comes with vomiting that started two days ago. Pt states no belly pain. Pt states bright red blood in vomit. Pt not on thinners.   [facility]

## 2023-12-08 NOTE — ED Notes (Signed)
 Group home called and informed that pt is ready to discharge.

## 2023-12-08 NOTE — ED Provider Notes (Signed)
 [facility] Note    Event Date/Time   First MD Initiated Contact with Patient 12/08/23 1022     (approximate)   History   Emesis  Pt comes with vomiting that started two days ago. Pt states no belly pain. Pt states bright red blood in vomit. Pt not on thinners.   [facility]   HPI Jonathan Cabrera is a 53 y.o. male PMH GERD, bipolar 1 disorder, schizoaffective disorder, CKD, anemia of chronic disease, prior GI hemorrhage presents for evaluation of vomiting - Nausea and vomiting for about 2 days.  Notes that he has been having some bright red blood in his vomit.  No black or bloody stools.  No diarrhea.  Denies any abdominal pain.  Not on any blood thinners..  Per chart review, last EGD 02/2023, impression below: - Normal ampulla, duodenal bulb, first portion of the duodenum and second portion of the duodenum.  - Z- line regular, 38 cm from the incisors.  - Esophagogastric landmarks identified.  - 4 cm hiatal hernia.  - Multiple gastric polyps. Biopsied. - Gastritis. Biopsied.  Appears was admitted in April 2024 for hematemesis, small hemoglobin drop.  EGD with gastritis esophagitis, no bleeding, started on omeprazole  40 mg twice daily with plan for repeat outpatient EGD (as above).     Physical Exam   Triage Vital Signs: ED Triage Vitals  Encounter Vitals Group     BP 12/08/23 0918 110/86     Girls Systolic BP Percentile --      Girls Diastolic BP Percentile --      Boys Systolic BP Percentile --      Boys Diastolic BP Percentile --      Pulse Rate 12/08/23 0918 79     Resp 12/08/23 0918 18     Temp 12/08/23 0918 98.7 F (37.1 C)     Temp Source 12/08/23 0918 Oral     SpO2 12/08/23 0918 99 %     Weight --      Height --      Head Circumference --      Peak Flow --      Pain Score 12/08/23 0914 0     Pain Loc --      Pain Education --      Exclude from Growth Chart --     Most recent vital signs: Vitals:    12/08/23 0918  BP: 110/86  Pulse: 79  Resp: 18  Temp: 98.7 F (37.1 C)  SpO2: 99%     General: Awake, no distress.  CV:  Good peripheral perfusion. RRR, RP 2+ Resp:  Normal effort. CTAB Abd:  No distention. Nontender to deep palpation throughout   ED Results / Procedures / Treatments   Labs (all labs ordered are listed, but only abnormal results are displayed) Labs Reviewed  COMPREHENSIVE METABOLIC PANEL WITH GFR - Abnormal; Notable for the following components:      Result Value   Glucose, Bld 117 (*)    All other components within normal limits  RESP PANEL BY RT-PCR (RSV, FLU A&B, COVID)  RVPGX2  LIPASE, BLOOD  CBC  HEMOGLOBIN AND HEMATOCRIT, BLOOD  URINALYSIS, ROUTINE W REFLEX MICROSCOPIC     EKG  Ecg = sinus rhythm, rate 56, no gross ST elevation or depression, no significant repolarization abnormality, normal axis, normal intervals.  No evidence of ischemia no arrhythmia on my interpretation.   RADIOLOGY N/a    PROCEDURES:  Critical  Care performed: No  Procedures   MEDICATIONS ORDERED IN ED: Medications  sodium chloride  0.9 % bolus 1,000 mL (1,000 mLs Intravenous New Bag/Given 12/08/23 1117)  ondansetron  (ZOFRAN ) injection 4 mg (4 mg Intravenous Given 12/08/23 1117)  pantoprazole  (PROTONIX ) injection 40 mg (40 mg Intravenous Given 12/08/23 1117)  alum & mag hydroxide-simeth (MAALOX/MYLANTA) 200-200-20 MG/5ML suspension 30 mL (30 mLs Oral Given 12/08/23 1117)    And  lidocaine  (XYLOCAINE ) 2 % viscous mouth solution 15 mL (15 mLs Oral Given 12/08/23 1117)     IMPRESSION / MDM / ASSESSMENT AND PLAN / ED COURSE  I reviewed the triage vital signs and the nursing notes.                              DDX/MDM/AP: Differential diagnosis includes, but is not limited to, recurrent gastritis, consider viral syndrome including influenza or COVID-19, no history of esophageal varices and no underlying cirrhosis.  No complaints of abdominal pain, do not suspect  perforated ulcer or other acute intra-abdominal pathology.  Plan: - Labs - IV fluid - Zofran , Protonix , viscous lidocaine  and Maalox - Reassess - No indication for emergent abdominal imaging at this time  Patient's presentation is most consistent with acute presentation with potential threat to life or bodily function.  The patient is on the cardiac monitor to evaluate for evidence of arrhythmia and/or significant heart rate changes.  ED course below.  Workup unremarkable including repeat H&H, no drop in hemoglobin.  No evidence of any significant GI bleeding at this time, suspect gastritis with mild bleeding.  Patient feeling much better after Protonix , Maalox, Zofran  here-discharged with same medications.  Plan for PMD and GI follow-up, already established with both.  ED return precautions in place.  Patient agrees with plan.  Clinical Course as of 12/08/23 1547  Sun Dec 08, 2023  1047 CBC with no leukocytosis, no anemia  CMP reviewed, unremarkable, no isolated elevated BUN  Lipase normal [MM]  1434 Repeat H&H stable, no concern for acute pathology at this time [MM]  1528 Reevaluated, feeling much better, no further vomiting.  Reassured by stable H&H.  Amenable to discharge home.  Says he will follow-up closely with his primary care doctor, also recommended he follow-up with his gastroenterologist.  Will start back on PPI and prescribe Zofran .  ED return precautions in place.  Patient agrees with plan.  No concern for significant bleeding at this time, stable for outpatient follow-up. [MM]    Clinical Course User Index [MM] Clarine Ozell LABOR, MD     FINAL CLINICAL IMPRESSION(S) / ED DIAGNOSES   Final diagnoses:  Nausea and vomiting, unspecified vomiting type  Hematemesis, unspecified whether nausea present     Rx / DC Orders   ED Discharge Orders          Ordered    pantoprazole  (PROTONIX ) 20 MG tablet  Daily        12/08/23 1530    ondansetron  (ZOFRAN -ODT) 4 MG  disintegrating tablet  Every 8 hours PRN        12/08/23 1530    aluminum -magnesium  hydroxide-simethicone  (MAALOX) 200-200-20 MG/5ML SUSP  3 times daily before meals & bedtime        12/08/23 1532             Note:  This document was prepared using Dragon voice recognition software and may include unintentional dictation errors.   Clarine Ozell LABOR, MD 12/08/23 [phone]

## 2023-12-08 NOTE — ED Notes (Signed)
 Attempted to call group home. No answer. Voice mailbox full.
